# Patient Record
Sex: Female | Born: 1960 | Race: White | Hispanic: No | Marital: Married | Smoking: Never smoker
Health system: Southern US, Community
[De-identification: ages and names within clinical notes are randomized; demographics above are authoritative.]

## PROBLEM LIST (undated history)

## (undated) DIAGNOSIS — D699 Hemorrhagic condition, unspecified: Secondary | ICD-10-CM

## (undated) HISTORY — PX: APPENDECTOMY: SHX54

## (undated) HISTORY — PX: WISDOM TOOTH EXTRACTION: SHX21

## (undated) HISTORY — PX: LAPAROSCOPY: SHX197

## (undated) HISTORY — PX: CERVICAL CONE BIOPSY: SUR198

## (undated) HISTORY — PX: KNEE SURGERY: SHX244

---

## 1998-12-07 ENCOUNTER — Inpatient Hospital Stay (HOSPITAL_COMMUNITY): Admission: EM | Admit: 1998-12-07 | Discharge: 1998-12-08 | Payer: Self-pay | Admitting: Internal Medicine

## 1998-12-07 ENCOUNTER — Encounter: Payer: Self-pay | Admitting: Internal Medicine

## 1999-11-30 ENCOUNTER — Other Ambulatory Visit: Admission: RE | Admit: 1999-11-30 | Discharge: 1999-11-30 | Payer: Self-pay | Admitting: *Deleted

## 2000-12-26 ENCOUNTER — Other Ambulatory Visit: Admission: RE | Admit: 2000-12-26 | Discharge: 2000-12-26 | Payer: Self-pay | Admitting: *Deleted

## 2001-12-28 ENCOUNTER — Emergency Department (HOSPITAL_COMMUNITY): Admission: EM | Admit: 2001-12-28 | Discharge: 2001-12-28 | Payer: Self-pay | Admitting: Emergency Medicine

## 2001-12-30 ENCOUNTER — Encounter: Payer: Self-pay | Admitting: Gastroenterology

## 2001-12-30 ENCOUNTER — Ambulatory Visit (HOSPITAL_COMMUNITY): Admission: RE | Admit: 2001-12-30 | Discharge: 2001-12-30 | Payer: Self-pay | Admitting: Gastroenterology

## 2005-04-23 HISTORY — PX: ABDOMINAL HYSTERECTOMY: SHX81

## 2015-03-28 ENCOUNTER — Encounter: Payer: Self-pay | Admitting: Internal Medicine

## 2015-06-13 ENCOUNTER — Encounter: Payer: Self-pay | Admitting: Gastroenterology

## 2015-08-04 ENCOUNTER — Ambulatory Visit (AMBULATORY_SURGERY_CENTER): Payer: Self-pay | Admitting: *Deleted

## 2015-08-04 VITALS — Ht 64.0 in | Wt 164.0 lb

## 2015-08-04 DIAGNOSIS — Z1211 Encounter for screening for malignant neoplasm of colon: Secondary | ICD-10-CM

## 2015-08-04 MED ORDER — NA SULFATE-K SULFATE-MG SULF 17.5-3.13-1.6 GM/177ML PO SOLN: 1.0000 | Freq: Once | ORAL | Status: DC

## 2015-08-04 NOTE — Progress Notes (Signed)
No egg or soy allergy known to patient  No issues with past sedation with any surgeries  or procedures, no intubation problems  No diet pills per patient No home 02 use per patient  No blood thinners per patient  Pt denies issues with constipation  emmi video declined

## 2015-08-19 ENCOUNTER — Ambulatory Visit (AMBULATORY_SURGERY_CENTER): Payer: BLUE CROSS/BLUE SHIELD | Admitting: Gastroenterology

## 2015-08-19 VITALS — BP 126/72 | HR 55 | Temp 97.5°F | Resp 12 | Ht 64.0 in | Wt 164.0 lb

## 2015-08-19 DIAGNOSIS — Z1211 Encounter for screening for malignant neoplasm of colon: Secondary | ICD-10-CM

## 2015-08-19 DIAGNOSIS — K621 Rectal polyp: Secondary | ICD-10-CM | POA: Diagnosis not present

## 2015-08-19 DIAGNOSIS — D128 Benign neoplasm of rectum: Secondary | ICD-10-CM

## 2015-08-19 DIAGNOSIS — K635 Polyp of colon: Secondary | ICD-10-CM | POA: Diagnosis not present

## 2015-08-19 MED ORDER — SODIUM CHLORIDE 0.9 % IV SOLN: 500.0000 mL | INTRAVENOUS | Status: DC

## 2015-08-19 NOTE — Patient Instructions (Signed)
Discharge instructions given. Handouts on polyps and hemorrhoids. Resume previous medications. YOU HAD AN ENDOSCOPIC PROCEDURE TODAY AT THE [facility]:   Refer to the procedure report that was given to you for any specific questions about what was found during the examination.  If the procedure report does not answer your questions, please call your gastroenterologist to clarify.  If you requested that your care partner not be given the details of your procedure findings, then the procedure report has been included in a sealed envelope for you to review at your convenience later.  YOU SHOULD EXPECT: Some feelings of bloating in the abdomen. Passage of more gas than usual.  Walking can help get rid of the air that was put into your GI tract during the procedure and reduce the bloating. If you had a lower endoscopy (such as a colonoscopy or flexible sigmoidoscopy) you may notice spotting of blood in your stool or on the toilet paper. If you underwent a bowel prep for your procedure, you may not have a normal bowel movement for a few days.  Please Note:  You might notice some irritation and congestion in your nose or some drainage.  This is from the oxygen used during your procedure.  There is no need for concern and it should clear up in a day or so.  SYMPTOMS TO REPORT IMMEDIATELY:   Following lower endoscopy (colonoscopy or flexible sigmoidoscopy):  Excessive amounts of blood in the stool  Significant tenderness or worsening of abdominal pains  Swelling of the abdomen that is new, acute  Fever of 100F or higher   For urgent or emergent issues, a gastroenterologist can be reached at any hour by calling [phone].   DIET: Your first meal following the procedure should be a small meal and then it is ok to progress to your normal diet. Heavy or fried foods are harder to digest and may make you feel nauseous or bloated.  Likewise, meals heavy in dairy and vegetables can increase  bloating.  Drink plenty of fluids but you should avoid alcoholic beverages for 24 hours.  ACTIVITY:  You should plan to take it easy for the rest of today and you should NOT DRIVE or use heavy machinery until tomorrow (because of the sedation medicines used during the test).    FOLLOW UP: Our staff will call the number listed on your records the next business day following your procedure to check on you and address any questions or concerns that you may have regarding the information given to you following your procedure. If we do not reach you, we will leave a message.  However, if you are feeling well and you are not experiencing any problems, there is no need to return our call.  We will assume that you have returned to your regular daily activities without incident.  If any biopsies were taken you will be contacted by phone or by letter within the next 1-3 weeks.  Please call us at [phone] if you have not heard about the biopsies in 3 weeks.    SIGNATURES/CONFIDENTIALITY: You and/or your care partner have signed paperwork which will be entered into your electronic medical record.  These signatures attest to the fact that that the information above on your After Visit Summary has been reviewed and is understood.  Full responsibility of the confidentiality of this discharge information lies with you and/or your care-partner. 

## 2015-08-19 NOTE — Progress Notes (Signed)
To recovery, report to McCoy, RN, VSS 

## 2015-08-19 NOTE — Op Note (Signed)
[facility] Patient Name: Wanda Duke Procedure Date: 08/19/2015 10:41 AM MRN: [id number] Endoscopist: Remo Lipps P. Havery Moros , MD Age: 55 Date of Birth: 1961-02-19 Gender: Female Procedure:                Colonoscopy Indications:              Screening for malignant neoplasm in the colon, This                            is the patient's first colonoscopy Medicines:                Monitored Anesthesia Care Procedure:                Pre-Anesthesia Assessment:                           - Prior to the procedure, a History and Physical                            was performed, and patient medications and                            allergies were reviewed. The patient's tolerance of                            previous anesthesia was also reviewed. The risks                            and benefits of the procedure and the sedation                            options and risks were discussed with the patient.                            All questions were answered, and informed consent                            was obtained. Prior Anticoagulants: The patient has                            taken no previous anticoagulant or antiplatelet                            agents. ASA Grade Assessment: I - A normal, healthy                            patient. After reviewing the risks and benefits,                            the patient was deemed in satisfactory condition to                            undergo the procedure.  After obtaining informed consent, the colonoscope                            was passed under direct vision. Throughout the                            procedure, the patient's blood pressure, pulse, and                            oxygen saturations were monitored continuously. The                            Model PCF-H190L [id number] scope was introduced                            through the anus and advanced to the the cecum,           identified by appendiceal orifice and ileocecal                            valve. The colonoscopy was performed without                            difficulty. The patient tolerated the procedure                            well. The quality of the bowel preparation was                            good. The ileocecal valve, appendiceal orifice, and                            rectum were photographed. Scope In: 10:45:28 AM Scope Out: 11:07:00 AM Scope Withdrawal Time: 0 hours 17 minutes 40 seconds  Total Procedure Duration: 0 hours 21 minutes 32 seconds  Findings:                 The perianal and digital rectal examinations were                            normal.                           A 3 mm polyp was found in the recto-sigmoid colon.                            The polyp was sessile. The polyp was removed with a                            cold biopsy forceps. Resection and retrieval were                            complete.                           Non-bleeding internal  hemorrhoids were found during                            retroflexion. The hemorrhoids were small.                           The exam was otherwise without abnormality. Complications:            No immediate complications. Estimated blood loss:                            Minimal. Estimated Blood Loss:     Estimated blood loss was minimal. Impression:               - One 3 mm polyp at the recto-sigmoid colon,                            removed with a cold biopsy forceps. Resected and                            retrieved.                           - Non-bleeding small internal hemorrhoids.                           - The examination was otherwise normal. Recommendation:           - Patient has a contact number available for                            emergencies. The signs and symptoms of potential                            delayed complications were discussed with the                            patient. Return  to normal activities tomorrow.                            Written discharge instructions were provided to the                            patient.                           - Resume previous diet.                           - Continue present medications.                           - Await pathology results.                           - Repeat colonoscopy is recommended for  surveillance if the polyp that was removed is                            determined to be a pre-cancerous type of polyp. The                            colonoscopy date will be determined after pathology                            results from today's exam become available for                            review. Remo Lipps P. Armbruster, MD 08/19/2015 11:10:01 AM This report has been signed electronically.

## 2015-08-19 NOTE — Progress Notes (Signed)
Called to room to assist during endoscopic procedure.  Patient ID and intended procedure confirmed with present staff. Received instructions for my participation in the procedure from the performing physician.  

## 2015-08-22 ENCOUNTER — Telehealth: Payer: Self-pay | Admitting: *Deleted

## 2015-08-22 NOTE — Telephone Encounter (Signed)
  Follow up Call-  Call back number 08/19/2015  Post procedure Call Back phone  # [phone]  Permission to leave phone message Yes     Patient questions:  Do you have a fever, pain , or abdominal swelling? No. Pain Score  0 *  Have you tolerated food without any problems? Yes.    Have you been able to return to your normal activities? Yes.    Do you have any questions about your discharge instructions: Diet   No. Medications  No. Follow up visit  No.  Do you have questions or concerns about your Care? No.  Actions: * If pain score is 4 or above: No action needed, pain <4.

## 2015-08-29 ENCOUNTER — Encounter: Payer: Self-pay | Admitting: Gastroenterology

## 2015-11-03 DIAGNOSIS — Z6828 Body mass index (BMI) 28.0-28.9, adult: Secondary | ICD-10-CM | POA: Diagnosis not present

## 2015-11-03 DIAGNOSIS — L0889 Other specified local infections of the skin and subcutaneous tissue: Secondary | ICD-10-CM | POA: Diagnosis not present

## 2016-01-17 DIAGNOSIS — J069 Acute upper respiratory infection, unspecified: Secondary | ICD-10-CM | POA: Diagnosis not present

## 2016-01-23 DIAGNOSIS — Z0000 Encounter for general adult medical examination without abnormal findings: Secondary | ICD-10-CM | POA: Diagnosis not present

## 2016-01-23 DIAGNOSIS — N390 Urinary tract infection, site not specified: Secondary | ICD-10-CM | POA: Diagnosis not present

## 2016-01-23 DIAGNOSIS — R8299 Other abnormal findings in urine: Secondary | ICD-10-CM | POA: Diagnosis not present

## 2016-01-30 DIAGNOSIS — Z6829 Body mass index (BMI) 29.0-29.9, adult: Secondary | ICD-10-CM | POA: Diagnosis not present

## 2016-01-30 DIAGNOSIS — Z780 Asymptomatic menopausal state: Secondary | ICD-10-CM | POA: Diagnosis not present

## 2016-01-30 DIAGNOSIS — Z0000 Encounter for general adult medical examination without abnormal findings: Secondary | ICD-10-CM | POA: Diagnosis not present

## 2016-01-30 DIAGNOSIS — I8390 Asymptomatic varicose veins of unspecified lower extremity: Secondary | ICD-10-CM | POA: Diagnosis not present

## 2016-01-30 DIAGNOSIS — Z1389 Encounter for screening for other disorder: Secondary | ICD-10-CM | POA: Diagnosis not present

## 2016-04-30 ENCOUNTER — Other Ambulatory Visit: Payer: Self-pay | Admitting: Internal Medicine

## 2016-04-30 DIAGNOSIS — Z8249 Family history of ischemic heart disease and other diseases of the circulatory system: Secondary | ICD-10-CM

## 2016-05-04 ENCOUNTER — Ambulatory Visit
Admission: RE | Admit: 2016-05-04 | Discharge: 2016-05-04 | Disposition: A | Discharge status: Home / Self Care | Payer: No Typology Code available for payment source | Source: Ambulatory Visit | Attending: Internal Medicine | Admitting: Internal Medicine

## 2016-05-04 DIAGNOSIS — Z8249 Family history of ischemic heart disease and other diseases of the circulatory system: Secondary | ICD-10-CM

## 2016-05-06 ENCOUNTER — Telehealth: Payer: BLUE CROSS/BLUE SHIELD | Admitting: Family

## 2016-05-06 DIAGNOSIS — J111 Influenza due to unidentified influenza virus with other respiratory manifestations: Secondary | ICD-10-CM

## 2016-05-06 MED ORDER — BENZONATATE 100 MG PO CAPS: 100.0000 mg | ORAL_CAPSULE | Freq: Three times a day (TID) | ORAL | 0 refills | Status: DC | PRN

## 2016-05-06 MED ORDER — OSELTAMIVIR PHOSPHATE 75 MG PO CAPS: 75.0000 mg | ORAL_CAPSULE | Freq: Two times a day (BID) | ORAL | 0 refills | Status: DC

## 2016-05-06 NOTE — Progress Notes (Signed)
E visit for Flu like symptoms   We are sorry that you are not feeling well.  Here is how we plan to help! Based on what you have shared with me it looks like you may have a respiratory virus that may be influenza.  Influenza or "the flu" is   an infection caused by a respiratory virus. The flu virus is highly contagious and persons who did not receive their yearly flu vaccination may "catch" the flu from close contact.  We have anti-viral medications to treat the viruses that cause this infection. They are not a "cure" and only shorten the course of the infection. These prescriptions are most effective when they are given within the first 2 days of "flu" symptoms. Antiviral medication are indicated if you have a high risk of complications from the flu. You should  also consider an antiviral medication if you are in close contact with someone who is at risk. These medications can help patients avoid complications from the flu  but have side effects that you should know. Possible side effects from Tamiflu or oseltamivir include nausea, vomiting, diarrhea, dizziness, headaches, eye redness, sleep problems or other respiratory symptoms. You should not take Tamiflu if you have an allergy to oseltamivir or any to the ingredients in Tamiflu.  Based upon your symptoms and potential risk factors I have prescribed Oseltamivir (Tamiflu).  It has been sent to your designated pharmacy.  You will take one 75 mg capsule orally twice a day for the next 5 days.   You meet nearly all the criteria for the flu aside from high fever and rash. There is a lot going around right now and I would use caution. If it is not the flu, it is consistent with a respiratory virus in which case the care is supportive. I am prescribing Tessalon Perles (100-200mg , 1-2capsules) every 8 hours as needed for cough also.   ANYONE WHO HAS FLU SYMPTOMS SHOULD: . Stay home. The flu is highly contagious and going out or to work exposes  others! . Be sure to drink plenty of fluids. Water is fine as well as fruit juices, sodas and electrolyte beverages. You may want to stay away from caffeine or alcohol. If you are nauseated, try taking small sips of liquids. How do you know if you are getting enough fluid? Your urine should be a pale yellow or almost colorless. . Get rest. . Taking a steamy shower or using a humidifier may help nasal congestion and ease sore throat pain. Using a saline nasal spray works much the same way. . Cough drops, hard candies and sore throat lozenges may ease your cough. . Line up a caregiver. Have someone check on you regularly.   GET HELP RIGHT AWAY IF: . You cannot keep down liquids or your medications. . You become short of breath . Your fell like you are going to pass out or loose consciousness. . Your symptoms persist after you have completed your treatment plan MAKE SURE YOU   Understand these instructions.  Will watch your condition.  Will get help right away if you are not doing well or get worse.  Your e-visit answers were reviewed by a board certified advanced clinical practitioner to complete your personal care plan.  Depending on the condition, your plan could have included both over the counter or prescription medications.  If there is a problem please reply  once you have received a response from your provider.  Your safety is important to  [place].  If you have drug allergies check your prescription carefully.    You can use MyChart to ask questions about today's visit, request a non-urgent call back, or ask for a work or school excuse for 24 hours related to this e-Visit. If it has been greater than 24 hours you will need to follow up with your provider, or enter a new e-Visit to address those concerns.  You will get an e-mail in the next two days asking about your experience.  I hope that your e-visit has been valuable and will speed your recovery. Thank you for using e-visits.

## 2016-12-04 ENCOUNTER — Ambulatory Visit (HOSPITAL_COMMUNITY)
Admission: EM | Admit: 2016-12-04 | Discharge: 2016-12-04 | Disposition: A | Discharge status: Home / Self Care | Payer: BLUE CROSS/BLUE SHIELD | Source: Home / Self Care

## 2016-12-04 ENCOUNTER — Encounter (HOSPITAL_COMMUNITY): Payer: Self-pay | Admitting: Nurse Practitioner

## 2016-12-04 ENCOUNTER — Observation Stay (HOSPITAL_COMMUNITY)
Admission: EM | Admit: 2016-12-04 | Discharge: 2016-12-05 | Disposition: A | Discharge status: Home / Self Care | Payer: BLUE CROSS/BLUE SHIELD | Attending: Physician Assistant | Admitting: Physician Assistant

## 2016-12-04 ENCOUNTER — Encounter (HOSPITAL_COMMUNITY)
Admission: EM | Disposition: A | Discharge status: Home / Self Care | Payer: Self-pay | Source: Home / Self Care | Attending: Emergency Medicine

## 2016-12-04 ENCOUNTER — Emergency Department (HOSPITAL_COMMUNITY): Payer: BLUE CROSS/BLUE SHIELD | Admitting: Anesthesiology

## 2016-12-04 ENCOUNTER — Encounter (HOSPITAL_COMMUNITY): Payer: Self-pay | Admitting: Emergency Medicine

## 2016-12-04 ENCOUNTER — Emergency Department (HOSPITAL_COMMUNITY): Payer: BLUE CROSS/BLUE SHIELD

## 2016-12-04 DIAGNOSIS — R1110 Vomiting, unspecified: Secondary | ICD-10-CM | POA: Diagnosis not present

## 2016-12-04 DIAGNOSIS — R109 Unspecified abdominal pain: Secondary | ICD-10-CM | POA: Diagnosis not present

## 2016-12-04 DIAGNOSIS — R1031 Right lower quadrant pain: Secondary | ICD-10-CM

## 2016-12-04 DIAGNOSIS — R112 Nausea with vomiting, unspecified: Secondary | ICD-10-CM | POA: Diagnosis not present

## 2016-12-04 DIAGNOSIS — Z90710 Acquired absence of both cervix and uterus: Secondary | ICD-10-CM | POA: Diagnosis not present

## 2016-12-04 DIAGNOSIS — K37 Unspecified appendicitis: Secondary | ICD-10-CM | POA: Diagnosis present

## 2016-12-04 DIAGNOSIS — K353 Acute appendicitis with localized peritonitis: Secondary | ICD-10-CM | POA: Diagnosis not present

## 2016-12-04 DIAGNOSIS — G8929 Other chronic pain: Secondary | ICD-10-CM | POA: Diagnosis not present

## 2016-12-04 DIAGNOSIS — Z881 Allergy status to other antibiotic agents status: Secondary | ICD-10-CM | POA: Diagnosis not present

## 2016-12-04 DIAGNOSIS — K3580 Unspecified acute appendicitis: Secondary | ICD-10-CM | POA: Diagnosis not present

## 2016-12-04 DIAGNOSIS — Z9889 Other specified postprocedural states: Secondary | ICD-10-CM | POA: Diagnosis not present

## 2016-12-04 HISTORY — PX: LAPAROSCOPIC APPENDECTOMY: SHX408

## 2016-12-04 LAB — COMPREHENSIVE METABOLIC PANEL
ALT: 12 U/L — ABNORMAL LOW (ref 14–54)
AST: 16 U/L (ref 15–41)
Albumin: 3.7 g/dL (ref 3.5–5.0)
Alkaline Phosphatase: 80 U/L (ref 38–126)
Anion gap: 6 (ref 5–15)
BUN: 12 mg/dL (ref 6–20)
CO2: 28 mmol/L (ref 22–32)
Calcium: 8.8 mg/dL — ABNORMAL LOW (ref 8.9–10.3)
Chloride: 98 mmol/L — ABNORMAL LOW (ref 101–111)
Creatinine, Ser: 0.85 mg/dL (ref 0.44–1.00)
GFR calc Af Amer: >60 mL/min (ref >60)
GFR calc non Af Amer: >60 mL/min (ref >60)
Glucose, Bld: 91 mg/dL (ref 65–99)
Potassium: 3.1 mmol/L — ABNORMAL LOW (ref 3.5–5.1)
Sodium: 132 mmol/L — ABNORMAL LOW (ref 135–145)
Total Bilirubin: 0.8 mg/dL (ref 0.3–1.2)
Total Protein: 6.6 g/dL (ref 6.5–8.1)

## 2016-12-04 LAB — CBC WITH DIFFERENTIAL/PLATELET
Basophils Absolute: 0 10*3/uL (ref 0.0–0.1)
Basophils Relative: 0 %
Eosinophils Absolute: 0.1 10*3/uL (ref 0.0–0.7)
Eosinophils Relative: 1 %
HCT: 36.1 % (ref 36.0–46.0)
Hemoglobin: 12.1 g/dL (ref 12.0–15.0)
Lymphocytes Relative: 24 %
Lymphs Abs: 2.2 10*3/uL (ref 0.7–4.0)
MCH: 28.9 pg (ref 26.0–34.0)
MCHC: 33.5 g/dL (ref 30.0–36.0)
MCV: 86.2 fL (ref 78.0–100.0)
Monocytes Absolute: 0.6 10*3/uL (ref 0.1–1.0)
Monocytes Relative: 7 %
Neutro Abs: 6.1 10*3/uL (ref 1.7–7.7)
Neutrophils Relative %: 68 %
Platelets: 209 10*3/uL (ref 150–400)
RBC: 4.19 MIL/uL (ref 3.87–5.11)
RDW: 12.5 % (ref 11.5–15.5)
WBC: 9 10*3/uL (ref 4.0–10.5)

## 2016-12-04 LAB — LIPASE, BLOOD: Lipase: 29 U/L (ref 11–51)

## 2016-12-04 SURGERY — APPENDECTOMY, LAPAROSCOPIC
Anesthesia: General | Site: Abdomen

## 2016-12-04 MED ORDER — MIDAZOLAM HCL 5 MG/5ML IJ SOLN
INTRAMUSCULAR | Status: DC | PRN
  Administered 2016-12-04: 2 mg via INTRAVENOUS

## 2016-12-04 MED ORDER — DEXAMETHASONE SODIUM PHOSPHATE 10 MG/ML IJ SOLN
INTRAMUSCULAR | Status: DC | PRN
  Administered 2016-12-04: 6 mg via INTRAVENOUS

## 2016-12-04 MED ORDER — DEXAMETHASONE SODIUM PHOSPHATE 10 MG/ML IJ SOLN
INTRAMUSCULAR | Status: AC
  Filled 2016-12-04: qty 3

## 2016-12-04 MED ORDER — BUPIVACAINE-EPINEPHRINE 0.25% -1:200000 IJ SOLN
INTRAMUSCULAR | Status: DC | PRN
  Administered 2016-12-04: 10 mL

## 2016-12-04 MED ORDER — METRONIDAZOLE IN NACL 5-0.79 MG/ML-% IV SOLN
500.0000 mg | Freq: Once | INTRAVENOUS | Status: AC
Start: 2016-12-04 — End: 2016-12-04
  Administered 2016-12-04: 500 mg via INTRAVENOUS
  Filled 2016-12-04: qty 100

## 2016-12-04 MED ORDER — SIMETHICONE 80 MG PO CHEW: 40.0000 mg | CHEWABLE_TABLET | Freq: Four times a day (QID) | ORAL | Status: DC | PRN

## 2016-12-04 MED ORDER — 0.9 % SODIUM CHLORIDE (POUR BTL) OPTIME
TOPICAL | Status: DC | PRN
  Administered 2016-12-04: 1000 mL

## 2016-12-04 MED ORDER — METHOCARBAMOL 500 MG PO TABS: 500.0000 mg | ORAL_TABLET | Freq: Four times a day (QID) | ORAL | Status: DC | PRN

## 2016-12-04 MED ORDER — OXYCODONE HCL 5 MG PO TABS
5.0000 mg | ORAL_TABLET | ORAL | Status: DC | PRN
  Administered 2016-12-05 (×2): 10 mg via ORAL
  Filled 2016-12-04 (×2): qty 2

## 2016-12-04 MED ORDER — LIDOCAINE 2% (20 MG/ML) 5 ML SYRINGE
INTRAMUSCULAR | Status: AC
  Filled 2016-12-04: qty 15

## 2016-12-04 MED ORDER — MORPHINE SULFATE (PF) 4 MG/ML IV SOLN: 2.0000 mg | INTRAVENOUS | Status: DC | PRN

## 2016-12-04 MED ORDER — BUPIVACAINE-EPINEPHRINE (PF) 0.25% -1:200000 IJ SOLN
INTRAMUSCULAR | Status: AC
  Filled 2016-12-04: qty 30

## 2016-12-04 MED ORDER — MIDAZOLAM HCL 2 MG/2ML IJ SOLN: 0.5000 mg | Freq: Once | INTRAMUSCULAR | Status: DC | PRN

## 2016-12-04 MED ORDER — SUGAMMADEX SODIUM 200 MG/2ML IV SOLN
INTRAVENOUS | Status: AC
  Filled 2016-12-04: qty 2

## 2016-12-04 MED ORDER — ONDANSETRON HCL 4 MG/2ML IJ SOLN
4.0000 mg | Freq: Four times a day (QID) | INTRAMUSCULAR | Status: DC | PRN
  Administered 2016-12-05: 4 mg via INTRAVENOUS
  Filled 2016-12-04: qty 2

## 2016-12-04 MED ORDER — ENOXAPARIN SODIUM 40 MG/0.4ML ~~LOC~~ SOLN
40.0000 mg | SUBCUTANEOUS | Status: DC
  Administered 2016-12-05: 40 mg via SUBCUTANEOUS
  Filled 2016-12-04: qty 0.4

## 2016-12-04 MED ORDER — SCOPOLAMINE 1 MG/3DAYS TD PT72
MEDICATED_PATCH | TRANSDERMAL | Status: AC
  Filled 2016-12-04: qty 1

## 2016-12-04 MED ORDER — HYDROMORPHONE HCL 1 MG/ML IJ SOLN
0.2500 mg | INTRAMUSCULAR | Status: DC | PRN
  Administered 2016-12-04 (×2): 0.5 mg via INTRAVENOUS

## 2016-12-04 MED ORDER — SODIUM CHLORIDE 0.9 % IV SOLN
INTRAVENOUS | Status: DC | PRN
  Administered 2016-12-04 (×2): via INTRAVENOUS

## 2016-12-04 MED ORDER — FENTANYL CITRATE (PF) 250 MCG/5ML IJ SOLN
INTRAMUSCULAR | Status: AC
  Filled 2016-12-04: qty 5

## 2016-12-04 MED ORDER — PHENYLEPHRINE 40 MCG/ML (10ML) SYRINGE FOR IV PUSH (FOR BLOOD PRESSURE SUPPORT)
PREFILLED_SYRINGE | INTRAVENOUS | Status: AC
  Filled 2016-12-04: qty 20

## 2016-12-04 MED ORDER — KETOROLAC TROMETHAMINE 30 MG/ML IJ SOLN
INTRAMUSCULAR | Status: AC
  Filled 2016-12-04: qty 1

## 2016-12-04 MED ORDER — HYDROMORPHONE HCL 1 MG/ML IJ SOLN
INTRAMUSCULAR | Status: AC
  Filled 2016-12-04: qty 1

## 2016-12-04 MED ORDER — MIDAZOLAM HCL 2 MG/2ML IJ SOLN
INTRAMUSCULAR | Status: AC
  Filled 2016-12-04: qty 2

## 2016-12-04 MED ORDER — SCOPOLAMINE 1 MG/3DAYS TD PT72
MEDICATED_PATCH | TRANSDERMAL | Status: DC | PRN
  Administered 2016-12-04: 1 via TRANSDERMAL

## 2016-12-04 MED ORDER — DEXTROSE 5 % IV SOLN: 2.0000 g | INTRAVENOUS | Status: DC

## 2016-12-04 MED ORDER — FENTANYL CITRATE (PF) 250 MCG/5ML IJ SOLN
INTRAMUSCULAR | Status: DC | PRN
  Administered 2016-12-04: 100 ug via INTRAVENOUS

## 2016-12-04 MED ORDER — ONDANSETRON HCL 4 MG/2ML IJ SOLN
INTRAMUSCULAR | Status: DC | PRN
  Administered 2016-12-04: 4 mg via INTRAVENOUS

## 2016-12-04 MED ORDER — MEPERIDINE HCL 25 MG/ML IJ SOLN: 6.2500 mg | INTRAMUSCULAR | Status: DC | PRN

## 2016-12-04 MED ORDER — ONDANSETRON HCL 4 MG/2ML IJ SOLN
INTRAMUSCULAR | Status: AC
  Filled 2016-12-04: qty 6

## 2016-12-04 MED ORDER — DEXTROSE 5 % IV SOLN
2.0000 g | Freq: Once | INTRAVENOUS | Status: AC
  Administered 2016-12-04: 2 g via INTRAVENOUS
  Filled 2016-12-04: qty 2

## 2016-12-04 MED ORDER — SUCCINYLCHOLINE CHLORIDE 200 MG/10ML IV SOSY
PREFILLED_SYRINGE | INTRAVENOUS | Status: DC | PRN
  Administered 2016-12-04: 120 mg via INTRAVENOUS

## 2016-12-04 MED ORDER — PROPOFOL 10 MG/ML IV BOLUS
INTRAVENOUS | Status: DC | PRN
  Administered 2016-12-04: 200 mg via INTRAVENOUS

## 2016-12-04 MED ORDER — IOPAMIDOL (ISOVUE-300) INJECTION 61%
INTRAVENOUS | Status: AC
  Administered 2016-12-04: 100 mL
  Filled 2016-12-04: qty 100

## 2016-12-04 MED ORDER — SODIUM CHLORIDE 0.9 % IR SOLN
Status: DC | PRN
  Administered 2016-12-04: 1000 mL

## 2016-12-04 MED ORDER — PROMETHAZINE HCL 25 MG/ML IJ SOLN: 6.2500 mg | INTRAMUSCULAR | Status: DC | PRN

## 2016-12-04 MED ORDER — KETOROLAC TROMETHAMINE 30 MG/ML IJ SOLN
INTRAMUSCULAR | Status: DC | PRN
  Administered 2016-12-04: 30 mg via INTRAVENOUS

## 2016-12-04 MED ORDER — ACETAMINOPHEN 500 MG PO TABS
1000.0000 mg | ORAL_TABLET | Freq: Four times a day (QID) | ORAL | Status: DC
  Administered 2016-12-04 – 2016-12-05 (×3): 1000 mg via ORAL
  Filled 2016-12-04 (×3): qty 2

## 2016-12-04 MED ORDER — ROCURONIUM BROMIDE 10 MG/ML (PF) SYRINGE
PREFILLED_SYRINGE | INTRAVENOUS | Status: AC
  Filled 2016-12-04: qty 5

## 2016-12-04 MED ORDER — SUGAMMADEX SODIUM 200 MG/2ML IV SOLN
INTRAVENOUS | Status: DC | PRN
Start: 2016-12-04 — End: 2016-12-04
  Administered 2016-12-04: 200 mg via INTRAVENOUS

## 2016-12-04 MED ORDER — SODIUM CHLORIDE 0.9 % IV SOLN
INTRAVENOUS | Status: DC
  Administered 2016-12-04: via INTRAVENOUS

## 2016-12-04 MED ORDER — ROCURONIUM BROMIDE 10 MG/ML (PF) SYRINGE
PREFILLED_SYRINGE | INTRAVENOUS | Status: DC | PRN
  Administered 2016-12-04: 35 mg via INTRAVENOUS

## 2016-12-04 MED ORDER — METRONIDAZOLE IN NACL 5-0.79 MG/ML-% IV SOLN
500.0000 mg | Freq: Three times a day (TID) | INTRAVENOUS | Status: DC
  Administered 2016-12-05 (×2): 500 mg via INTRAVENOUS
  Filled 2016-12-04 (×3): qty 100

## 2016-12-04 MED ORDER — ONDANSETRON 4 MG PO TBDP: 4.0000 mg | ORAL_TABLET | Freq: Four times a day (QID) | ORAL | Status: DC | PRN

## 2016-12-04 MED ORDER — KETOROLAC TROMETHAMINE 15 MG/ML IJ SOLN
15.0000 mg | Freq: Four times a day (QID) | INTRAMUSCULAR | Status: DC | PRN
Start: 2016-12-04 — End: 2016-12-05
  Administered 2016-12-05: 15 mg via INTRAVENOUS
  Filled 2016-12-04: qty 1

## 2016-12-04 SURGICAL SUPPLY — 43 items
ADH SKN CLS LQ APL DERMABOND (GAUZE/BANDAGES/DRESSINGS) ×1
APPLIER CLIP ROT 10 11.4 M/L (STAPLE)
CANISTER SUCT 3000ML PPV (MISCELLANEOUS) ×2 IMPLANT
CHLORAPREP W/TINT 26ML (MISCELLANEOUS) ×2 IMPLANT
CLIP APPLIE ROT 10 11.4 M/L (STAPLE) IMPLANT
COVER SURGICAL LIGHT HANDLE (MISCELLANEOUS) ×2 IMPLANT
CUTTER FLEX LINEAR 45M (STAPLE) ×2 IMPLANT
DERMABOND ADHESIVE PROPEN (GAUZE/BANDAGES/DRESSINGS) ×1
DERMABOND ADVANCED (GAUZE/BANDAGES/DRESSINGS) ×1
DERMABOND ADVANCED .7 DNX12 (GAUZE/BANDAGES/DRESSINGS) ×1 IMPLANT
DERMABOND ADVANCED .7 DNX6 (GAUZE/BANDAGES/DRESSINGS) ×1 IMPLANT
DEVICE TROCAR PUNCTURE CLOSURE (ENDOMECHANICALS) ×2 IMPLANT
ELECT REM PT RETURN 9FT ADLT (ELECTROSURGICAL) ×2
ELECTRODE REM PT RTRN 9FT ADLT (ELECTROSURGICAL) ×1 IMPLANT
GLOVE BIO SURGEON STRL SZ7 (GLOVE) ×2 IMPLANT
GLOVE BIOGEL PI IND STRL 7.5 (GLOVE) ×1 IMPLANT
GLOVE BIOGEL PI INDICATOR 7.5 (GLOVE) ×1
GOWN STRL REUS W/ TWL LRG LVL3 (GOWN DISPOSABLE) ×3 IMPLANT
GOWN STRL REUS W/TWL LRG LVL3 (GOWN DISPOSABLE) ×6
KIT BASIN OR (CUSTOM PROCEDURE TRAY) ×2 IMPLANT
KIT ROOM TURNOVER OR (KITS) ×2 IMPLANT
NS IRRIG 1000ML POUR BTL (IV SOLUTION) ×2 IMPLANT
PAD ARMBOARD 7.5X6 YLW CONV (MISCELLANEOUS) ×4 IMPLANT
POUCH RETRIEVAL ECOSAC 10 (ENDOMECHANICALS) ×1 IMPLANT
POUCH RETRIEVAL ECOSAC 10MM (ENDOMECHANICALS) ×1
RELOAD 45 VASCULAR/THIN (ENDOMECHANICALS) ×2 IMPLANT
RELOAD STAPLE TA45 3.5 REG BLU (ENDOMECHANICALS) ×2 IMPLANT
SCISSORS LAP 5X35 DISP (ENDOMECHANICALS) IMPLANT
SET IRRIG TUBING LAPAROSCOPIC (IRRIGATION / IRRIGATOR) ×2 IMPLANT
SHEARS HARMONIC ACE PLUS 36CM (ENDOMECHANICALS) ×2 IMPLANT
SLEEVE ENDOPATH XCEL 5M (ENDOMECHANICALS) ×2 IMPLANT
SPECIMEN JAR SMALL (MISCELLANEOUS) ×2 IMPLANT
STRIP CLOSURE SKIN 1/2X4 (GAUZE/BANDAGES/DRESSINGS) ×2 IMPLANT
SUT MNCRL AB 4-0 PS2 18 (SUTURE) ×2 IMPLANT
SUT VICRYL 0 UR6 27IN ABS (SUTURE) ×2 IMPLANT
TAPE STRIPS DRAPE STRL (GAUZE/BANDAGES/DRESSINGS) ×2 IMPLANT
TOWEL OR 17X24 6PK STRL BLUE (TOWEL DISPOSABLE) ×2 IMPLANT
TOWEL OR 17X26 10 PK STRL BLUE (TOWEL DISPOSABLE) ×2 IMPLANT
TRAY FOLEY CATH SILVER 16FR (SET/KITS/TRAYS/PACK) ×2 IMPLANT
TRAY LAPAROSCOPIC MC (CUSTOM PROCEDURE TRAY) ×2 IMPLANT
TROCAR XCEL BLUNT TIP 100MML (ENDOMECHANICALS) ×2 IMPLANT
TROCAR XCEL NON-BLD 5MMX100MML (ENDOMECHANICALS) ×2 IMPLANT
TUBING INSUFFLATION (TUBING) ×2 IMPLANT

## 2016-12-04 NOTE — ED Provider Notes (Signed)
[facility] DEPT Provider Note   CSN: [id number] Arrival date & time: 12/04/16  1740     History   Chief Complaint Chief Complaint  Patient presents with  . Abdominal Pain    HPI Wanda Duke is a 56 y.o. female.  HPI   56 year old female presents today with complaints of abdominal pain. Patient reports approximately 3 days ago she developed right lower quadrant abdominal pain. She notes this was sharp in nature, no radiation of symptoms. She notes this is worse with palpation, worse with ambulation. She notes symptoms continue to worsen. She developed nonbloody diarrhea, low-grade fever last night, some nausea with no vomiting. Patient denies any history of the same. Patient reports no close sick contacts. Patient does not take any daily medications. Patient has a history of 2 C-sections, status post abdominal hysterectomy.  Patient notes her last by mouth intake was around 3:30 today when she had lunch and also took Aleve.     History reviewed. No pertinent past medical history.  There are no active problems to display for this patient.   Past Surgical History:  Procedure Laterality Date  . CESAREAN SECTION     x2  . HYSTEROTOMY    . LAPAROSCOPY     for endometriosis  . WISDOM TOOTH EXTRACTION      OB History    No data available       Home Medications    Prior to Admission medications   Medication Sig Start Date End Date Taking? Authorizing Provider  naproxen sodium (ANAPROX) 220 MG tablet Take 220-440 mg by mouth 2 (two) times daily as needed (for pain).   Yes [provider]  benzonatate (TESSALON PERLES) 100 MG capsule Take 1-2 capsules (100-200 mg total) by mouth every 8 (eight) hours as needed for cough. Patient not taking: Reported on 12/04/2016 05/06/16   Benjamine Mola, FNP  oseltamivir (TAMIFLU) 75 MG capsule Take 1 capsule (75 mg total) by mouth 2 (two) times daily. Patient not taking: Reported on 12/04/2016 05/06/16   Benjamine Mola, FNP      Family History Family History  Problem Relation Age of Onset  . Colon cancer Neg Hx   . Colon polyps Neg Hx   . Rectal cancer Neg Hx   . Stomach cancer Neg Hx     Social History Social History  Substance Use Topics  . Smoking status: Never Smoker  . Smokeless tobacco: Never Used  . Alcohol use 0.0 oz/week     Comment: rare      Allergies   Erythromycin   Review of Systems Review of Systems  All other systems reviewed and are negative.    Physical Exam Updated Vital Signs BP 130/75   Pulse 65   Temp 97.9 F (36.6 C) (Oral)   Resp 16   Ht 5' 3.5" (1.613 m)   Wt 74.4 kg (164 lb)   SpO2 99%   BMI 28.60 kg/m   Physical Exam  Constitutional: She is oriented to person, place, and time. She appears well-developed and well-nourished.  HENT:  Head: Normocephalic and atraumatic.  Eyes: Pupils are equal, round, and reactive to light. Conjunctivae are normal. Right eye exhibits no discharge. Left eye exhibits no discharge. No scleral icterus.  Neck: Normal range of motion. No JVD present. No tracheal deviation present.  Pulmonary/Chest: Effort normal. No stridor.  Abdominal:  Tenderness palpation or right lower quadrant  Neurological: She is alert and oriented to person, place, and time. Coordination normal.  Psychiatric: She has a normal mood and affect. Her behavior is normal. Judgment and thought content normal.  Nursing note and vitals reviewed.    ED Treatments / Results  Labs (all labs ordered are listed, but only abnormal results are displayed) Labs Reviewed  COMPREHENSIVE METABOLIC PANEL - Abnormal; Notable for the following:       Result Value   Sodium 132 (*)    Potassium 3.1 (*)    Chloride 98 (*)    Calcium 8.8 (*)    ALT 12 (*)    All other components within normal limits  CBC WITH DIFFERENTIAL/PLATELET  LIPASE, BLOOD  URINALYSIS, ROUTINE W REFLEX MICROSCOPIC    EKG  EKG Interpretation None       Radiology Ct Abdomen Pelvis W  Contrast  Result Date: 12/04/2016 CLINICAL DATA:  Abdominal pain for 2-3 days with nausea and vomiting EXAM: CT ABDOMEN AND PELVIS WITH CONTRAST TECHNIQUE: Multidetector CT imaging of the abdomen and pelvis was performed using the standard protocol following bolus administration of intravenous contrast. CONTRAST:  132mL ISOVUE-300 IOPAMIDOL (ISOVUE-300) INJECTION 61% COMPARISON:  Report 12/30/2001 FINDINGS: Lower chest: Lung bases demonstrate no acute consolidation or pleural effusion. Normal heart size. Hepatobiliary: Multiple subcentimeter hypodensities in the liver, too small to further characterize, probably cysts. No calcified gallstones or biliary dilatation Pancreas: Unremarkable. No pancreatic ductal dilatation or surrounding inflammatory changes. Spleen: Normal in size without focal abnormality. Adrenals/Urinary Tract: Adrenal glands are unremarkable. Kidneys are normal, without renal calculi, focal lesion, or hydronephrosis. Bladder is unremarkable. Stomach/Bowel: Stomach within normal limits. No dilated small bowel. Irregular thickening of the cecum. Enlarged appendix, measuring up to 13 mm in size. Mild edema and haziness of the surrounding fat in the right lower quadrant. Vascular/Lymphatic: No significant vascular findings are present. No enlarged abdominal or pelvic lymph nodes. Reproductive: Status post hysterectomy. No adnexal masses. Other: Negative for free air or free fluid Musculoskeletal: Degenerative changes. No acute or suspicious bone lesion. IMPRESSION: 1. Enlarged appendix up to 13 mm with mild edema/haziness in surrounding fat, concerning for an acute appendicitis. Negative for perforation or abscess. 2. There is wall thickening of the cecum, it is presumed that this is due to inflammation of the appendix 3. Subcentimeter liver hypodensities, too small to further characterize Electronically Signed   By: Donavan Foil M.D.   On: 12/04/2016 20:13    Procedures Procedures (including  critical care time)  Medications Ordered in ED Medications  cefTRIAXone (ROCEPHIN) 2 g in dextrose 5 % 50 mL IVPB (2 g Intravenous New Bag/Given 12/04/16 2049)    And  metroNIDAZOLE (FLAGYL) IVPB 500 mg (500 mg Intravenous New Bag/Given 12/04/16 2044)  iopamidol (ISOVUE-300) 61 % injection (100 mLs  Contrast Given 12/04/16 1956)     Initial Impression / Assessment and Plan / ED Course  I have reviewed the triage vital signs and the nursing notes.  Pertinent labs & imaging results that were available during my care of the patient were reviewed by me and considered in my medical decision making (see chart for details).      Final Clinical Impressions(s) / ED Diagnoses   Final diagnoses:  Acute appendicitis, unspecified acute appendicitis type    Labs: CBC, CMP, lipase  Imaging: CT abdomen and pelvis with  Consults: General Surgery   Therapeutics: Rocephin, Flagyl, normal saline  Discharge Meds:   Assessment/Plan:   56 year old female patient presents today with acute uncomplicated appendicitis. Gen. surgery consult at, antibiotics initiated here. Patient pain controlled without use of  pain medication.   New Prescriptions New Prescriptions   No medications on file     Francee Gentile 12/04/16 2117    Mesner, Corene Cornea, MD 12/04/16 2216

## 2016-12-04 NOTE — ED Notes (Signed)
Provider bedside explaining surgery

## 2016-12-04 NOTE — H&P (Signed)
Wanda Duke is an 56 y.o. female.   Chief Complaint: abd pain HPI: 15 yof who presents with Wanda Duke.  Wanda Duke has no fever. No n/v, has been having diarrhea.  Wanda Duke is otherwise healthy. Wanda Duke has nl colonoscopy last year.  Wanda Duke has prior lsc for infertility.   History reviewed. No pertinent past medical history.  Past Surgical History:  Procedure Laterality Date  . CESAREAN SECTION     x2  . HYSTEROTOMY    . LAPAROSCOPY     for endometriosis  . WISDOM TOOTH EXTRACTION      Family History  Problem Relation Age of Onset  . Colon cancer Neg Hx   . Colon polyps Neg Hx   . Rectal cancer Neg Hx   . Stomach cancer Neg Hx    Social History:  reports that Wanda Duke has never smoked. Wanda Duke has never used smokeless tobacco. Wanda Duke reports that Wanda Duke drinks alcohol. Wanda Duke reports that Wanda Duke does not use drugs.  Allergies:  Allergies  Allergen Reactions  . Erythromycin Nausea Only    Medications none  Results for orders placed or performed during the hospital encounter of 12/04/16 (from the past 48 hour(s))  CBC with Differential     Status: None   Collection Time: 12/04/16  6:40 PM  Result Value Ref Range   WBC 9.0 4.0 - 10.5 K/uL   RBC 4.19 3.87 - 5.11 MIL/uL   Hemoglobin 12.1 12.0 - 15.0 g/dL   HCT 36.1 36.0 - 46.0 %   MCV 86.2 78.0 - 100.0 fL   MCH 28.9 26.0 - 34.0 pg   MCHC 33.5 30.0 - 36.0 g/dL   RDW 12.5 11.5 - 15.5 %   Platelets 209 150 - 400 K/uL   Neutrophils Relative % 68 %   Neutro Abs 6.1 1.7 - 7.7 K/uL   Lymphocytes Relative 24 %   Lymphs Abs 2.2 0.7 - 4.0 K/uL   Monocytes Relative 7 %   Monocytes Absolute 0.6 0.1 - 1.0 K/uL   Eosinophils Relative 1 %   Eosinophils Absolute 0.1 0.0 - 0.7 K/uL   Basophils Relative 0 %   Basophils Absolute 0.0 0.0 - 0.1 K/uL  Comprehensive metabolic panel     Status: Abnormal   Collection Time: 12/04/16  6:40 PM  Result Value Ref Range   Sodium 132 (L) 135 - 145 mmol/L   Potassium 3.1 (L) 3.5 - 5.1 mmol/L   Chloride 98 (L) 101 -  111 mmol/L   CO2 28 22 - 32 mmol/L   Glucose, Bld 91 65 - 99 mg/dL   BUN 12 6 - 20 mg/dL   Creatinine, Ser 0.85 0.44 - 1.00 mg/dL   Calcium 8.8 (L) 8.9 - 10.3 mg/dL   Total Protein 6.6 6.5 - 8.1 g/dL   Albumin 3.7 3.5 - 5.0 g/dL   AST 16 15 - 41 U/L   ALT 12 (L) 14 - 54 U/L   Alkaline Phosphatase 80 38 - 126 U/L   Total Bilirubin 0.8 0.3 - 1.2 mg/dL   GFR calc non Af Amer >60 >60 mL/min   GFR calc Af Amer >60 >60 mL/min    Comment: (NOTE) The eGFR has been calculated using the CKD EPI equation. This calculation has not been validated in all clinical situations. eGFR's persistently <60 mL/min signify possible Chronic Kidney Disease.    Anion gap 6 5 - 15  Lipase, blood     Status: None   Collection Time: 12/04/16  6:40  PM  Result Value Ref Range   Lipase 29 11 - 51 U/L   Ct Abdomen Pelvis W Contrast  Result Date: 12/04/2016 CLINICAL DATA:  Abdominal pain for 2-3 days with nausea and vomiting EXAM: CT ABDOMEN AND PELVIS WITH CONTRAST TECHNIQUE: Multidetector CT imaging of the abdomen and pelvis was performed using the standard protocol following bolus administration of intravenous contrast. CONTRAST:  133m ISOVUE-300 IOPAMIDOL (ISOVUE-300) INJECTION 61% COMPARISON:  Report 12/30/2001 FINDINGS: Lower chest: Lung bases demonstrate no acute consolidation or pleural effusion. Normal heart size. Hepatobiliary: Multiple subcentimeter hypodensities in the liver, too small to further characterize, probably cysts. No calcified gallstones or biliary dilatation Pancreas: Unremarkable. No pancreatic ductal dilatation or surrounding inflammatory changes. Spleen: Normal in size without focal abnormality. Adrenals/Urinary Tract: Adrenal glands are unremarkable. Kidneys are normal, without renal calculi, focal lesion, or hydronephrosis. Bladder is unremarkable. Stomach/Bowel: Stomach within normal limits. No dilated small bowel. Irregular thickening of the cecum. Enlarged appendix, measuring up to 13 mm  in size. Mild edema and haziness of the surrounding fat in the right lower quadrant. Vascular/Lymphatic: No significant vascular findings are present. No enlarged abdominal or pelvic lymph nodes. Reproductive: Status post hysterectomy. No adnexal masses. Other: Negative for free air or free fluid Musculoskeletal: Degenerative changes. No acute or suspicious bone lesion. IMPRESSION: 1. Enlarged appendix up to 13 mm with mild edema/haziness in surrounding fat, concerning for an acute appendicitis. Negative for perforation or abscess. 2. There is wall thickening of the cecum, it is presumed that this is due to inflammation of the appendix 3. Subcentimeter liver hypodensities, too small to further characterize Electronically Signed   By: KDonavan FoilM.D.   On: 12/04/2016 20:13    Review of Systems  Gastrointestinal: Positive for abdominal pain and diarrhea.  All other systems reviewed and are negative.   Blood pressure 130/75, pulse 65, temperature 97.9 F (36.6 C), temperature source Oral, resp. rate 16, height 5' 3.5" (1.613 m), weight 74.4 kg (164 lb), SpO2 99 %. Physical Exam  Vitals reviewed. Constitutional: Wanda Duke is oriented to person, place, and time. Wanda Duke appears well-developed and well-nourished.  HENT:  Head: Normocephalic and atraumatic.  Right Ear: External ear normal.  Left Ear: External ear normal.  Eyes: No scleral icterus.  Neck: Neck supple.  Cardiovascular: Normal rate, regular rhythm and normal heart sounds.   Respiratory: Effort normal and breath sounds normal.  GI: Soft. Bowel sounds are normal. There is tenderness (Wanda).  Neurological: Wanda Duke is alert and oriented to person, place, and time.  Skin: Skin is warm and dry.     Assessment/Plan Appendicitis  Wanda Duke appears to have acute appendicitis. Discussed options and recommended lap appy. Risks including open surgery, postop abscess, bleeding, infection all discussed.  abx have been given. Will proceed tonight with  appendectomy  Wanda Geiger, MD 12/04/2016, 9:08 PM

## 2016-12-04 NOTE — ED Notes (Signed)
Patient transported to CT 

## 2016-12-04 NOTE — ED Provider Notes (Signed)
  [place]   [id number] 12/04/16 Arrival Time: 1700  ASSESSMENT & PLAN:  1. Abdominal pain, chronic, right lower quadrant     No orders of the defined types were placed in this encounter.   Reviewed expectations re: course of current medical issues. Questions answered. Outlined signs and symptoms indicating need for more acute intervention. Patient verbalized understanding. After Visit Summary given.   SUBJECTIVE:  Wanda Duke is a 56 y.o. female who presents with complaint of right lower quadrant abdominal pain and fever last night  ROS: As per HPI.   OBJECTIVE:  Vitals:   12/04/16 1722  BP: (!) 108/56  Pulse: 81  Resp: 17  Temp: 98.1 F (36.7 C)  TempSrc: Oral  SpO2: 98%     General appearance: alert; no distress HEENT: normocephalic; atraumatic; conjunctivae normal; TMs normal; nasal mucosa normal; oral mucosa normal Neck: supple Lungs: clear to auscultation bilaterally Heart: regular rate and rhythm Abdomen: Tender RLQ and periumbilical with guarding and rebound tenderness; bowel sounds normal; Neurologic: normal symmetric reflexes; normal gait Psychological:  alert and cooperative; normal mood and affect  No results found for this or any previous visit.  Labs Reviewed - No data to display  No results found.  Allergies  Allergen Reactions  . Erythromycin Nausea Only    PMHx, SurgHx, SocialHx, Medications, and Allergies were reviewed in the Visit Navigator and updated as appropriate.      Lysbeth Penner, [place] 12/04/16 Vernelle Emerald

## 2016-12-04 NOTE — ED Triage Notes (Signed)
Per pt. C/O of LRQ x2 days.  Pt was just at urgent care to rule out appendicitis.   VSS AXOx4

## 2016-12-04 NOTE — Anesthesia Postprocedure Evaluation (Signed)
Anesthesia Post Note  Patient: Wanda Duke  Procedure(s) Performed: Procedure(s) (LRB): APPENDECTOMY LAPAROSCOPIC (N/A)     Patient location during evaluation: PACU Anesthesia Type: General Level of consciousness: awake and alert, patient cooperative and oriented Pain management: pain level controlled Vital Signs Assessment: post-procedure vital signs reviewed and stable Respiratory status: spontaneous breathing, nonlabored ventilation and respiratory function stable Cardiovascular status: blood pressure returned to baseline and stable Postop Assessment: no signs of nausea or vomiting Anesthetic complications: no    Last Vitals:  Vitals:   12/04/16 2248 12/04/16 2300  BP: (!) 155/87 (!) 148/88  Pulse: 64 64  Resp: 15 17  Temp: (!) 36.2 C   SpO2: 99% 100%    Last Pain:  Vitals:   12/04/16 2300  TempSrc:   PainSc: 5                  Lucy Boardman,E. Kutter Schnepf

## 2016-12-04 NOTE — Discharge Instructions (Signed)
Please go to ED for higher level of care.

## 2016-12-04 NOTE — Anesthesia Procedure Notes (Signed)
Procedure Name: Intubation Date/Time: 12/04/2016 9:59 PM Performed by: Myna Bright Pre-anesthesia Checklist: Patient identified, Emergency Drugs available, Suction available and Patient being monitored Patient Re-evaluated:Patient Re-evaluated prior to induction Oxygen Delivery Method: Circle system utilized Preoxygenation: Pre-oxygenation with 100% oxygen Induction Type: IV induction, Rapid sequence and Cricoid Pressure applied Laryngoscope Size: Mac and 3 Grade View: Grade I Tube type: Oral Tube size: 7.0 mm Number of attempts: 1 Airway Equipment and Method: Stylet Placement Confirmation: ETT inserted through vocal cords under direct vision,  positive ETCO2 and breath sounds checked- equal and bilateral Secured at: 22 cm Tube secured with: Tape Dental Injury: Teeth and Oropharynx as per pre-operative assessment

## 2016-12-04 NOTE — Op Note (Signed)
Preoperative diagnosis: acute appendicitis Postoperative diagnosis: same as above Procedure: laparoscopic appendectomy Surgeon: Dr Serita Grammes Anesthesia: general EBL: minimal Drains none Specimen: appendix to pathology Complications: none Sponge count correct at completion Disposition to recovery stable  Indications: This is a 17 yof with acute appendicitis on ct scan and by clinical exam. We discussed laparoscopic appendectomy.   Procedure: After informed consent was obtained the patient was taken to the operating room. Shewas already onantibiotics. Sequential compression devices were on her legs. Shewas placed under general anesthesia without complication. Herabdomen was prepped and draped in the standard sterile surgical fashion. A surgical timeout was then performed.  I infiltrated marcaine belowthe umbilicus. I made a vertical incision and grasped the fascia. This was entered sharply. I then placed a 0 vicryl pursestring suture and inserted a Hasson trocarI then placed 2 other 5 mm trocars in llq and suprapubic region. I identified the appendix and grasped the base. The base was clean but thickened.  The appendix was not perforated. I divided the mesentery with the harmonic scalpel. I then divided the appendix with a GIA stapler. This was placed in a bag and removed. I viewed the staple line on the cecum and this appears to be intact and viable. There was no injury to cecum or small bowel noted. I then obtained hemostasis and irrigated.I removed the hassontrocar. I tied my pursestring down and added 2 0vicryl stitchesat the umbilical incision with the endoclose device. I then desufflated the abdomen and removed all my remaining trocars. I then closed these with 4-0 Monocryl and Dermabond. She tolerated this well was extubated and transferred to the recovery room in stable condition

## 2016-12-04 NOTE — Transfer of Care (Signed)
Immediate Anesthesia Transfer of Care Note  Patient: Ica Daye  Procedure(s) Performed: Procedure(s): APPENDECTOMY LAPAROSCOPIC (N/A)  Patient Location: PACU  Anesthesia Type:General  Level of Consciousness: awake, alert , oriented and patient cooperative  Airway & Oxygen Therapy: Patient Spontanous Breathing and Patient connected to nasal cannula oxygen  Post-op Assessment: Report given to RN, Post -op Vital signs reviewed and stable and Patient moving all extremities  Post vital signs: Reviewed and stable  Last Vitals:  Vitals:   12/04/16 2128 12/04/16 2248  BP: (!) 143/72   Pulse: 64 64  Resp: 16   Temp: 36.7 C (!) 36.2 C  SpO2: 99%     Last Pain:  Vitals:   12/04/16 2128  TempSrc: Oral  PainSc: 8          Complications: No apparent anesthesia complications

## 2016-12-04 NOTE — Progress Notes (Signed)
Received patient from PACU.  Patient AOx4, VS stable, pain at 6/10 from lap appe surgery, voided in PACU per PACU RN and O2Sat at 100% on RA.  Patient oriented to room, bed controls and call.  Administered scheduled Tylenol 1000 mg per order.  Gave ice water to drink.  Will monitor.

## 2016-12-04 NOTE — Anesthesia Preprocedure Evaluation (Addendum)
Anesthesia Evaluation  Patient identified by MRN, date of birth, ID band Patient awake    Reviewed: Allergy & Precautions, NPO status , Patient's Chart, lab work & pertinent test results  History of Anesthesia Complications Negative for: history of anesthetic complications  Airway Mallampati: I  TM Distance: >3 FB Neck ROM: Full    Dental  (+) Teeth Intact, Dental Advisory Given   Pulmonary neg pulmonary ROS,    breath sounds clear to auscultation       Cardiovascular negative cardio ROS   Rhythm:Regular Rate:Normal     Neuro/Psych negative neurological ROS     GI/Hepatic Neg liver ROS, Diarrhea with acute appy   Endo/Other  negative endocrine ROS  Renal/GU negative Renal ROS     Musculoskeletal negative musculoskeletal ROS (+)   Abdominal   Peds  Hematology negative hematology ROS (+)   Anesthesia Other Findings   Reproductive/Obstetrics Post menopausal                             Anesthesia Physical Anesthesia Plan  ASA: II and emergent  Anesthesia Plan: General   Post-op Pain Management:    Induction: Intravenous  PONV Risk Score and Plan: 4 or greater and Ondansetron, Dexamethasone, Midazolam and Scopolamine patch - Pre-op  Airway Management Planned: Oral ETT  Additional Equipment:   Intra-op Plan:   Post-operative Plan: Extubation in OR  Informed Consent: I have reviewed the patients History and Physical, chart, labs and discussed the procedure including the risks, benefits and alternatives for the proposed anesthesia with the patient or authorized representative who has indicated his/her understanding and acceptance.   Dental advisory given  Plan Discussed with: CRNA and Surgeon  Anesthesia Plan Comments: (Plan routine monitors, GETA)       Anesthesia Quick Evaluation

## 2016-12-04 NOTE — ED Triage Notes (Addendum)
Pt presents with c/o right lower abdominal pain. The pain began about two days ago. The pain is worse with palpation. She reports fevers, anorexia, diarrhea. She has not tried anything at home for her symptoms.

## 2016-12-05 ENCOUNTER — Encounter (HOSPITAL_COMMUNITY): Payer: Self-pay | Admitting: General Surgery

## 2016-12-05 LAB — URINALYSIS, ROUTINE W REFLEX MICROSCOPIC
Bacteria, UA: NONE SEEN
Bilirubin Urine: NEGATIVE
Glucose, UA: NEGATIVE mg/dL
Hgb urine dipstick: NEGATIVE
Ketones, ur: NEGATIVE mg/dL
Nitrite: NEGATIVE
Protein, ur: 30 mg/dL — AB
Specific Gravity, Urine: >1.046 — ABNORMAL HIGH (ref 1.005–1.030)
pH: 5 (ref 5.0–8.0)

## 2016-12-05 MED ORDER — OXYCODONE HCL 5 MG PO TABS: 5.0000 mg | ORAL_TABLET | ORAL | 0 refills | Status: DC | PRN

## 2016-12-05 NOTE — Discharge Instructions (Signed)
[facility] LAPAROSCOP[facility] SURGERY: POST OP INSTRUCTIONS  Always review your discharge instruction sheet given to you by the facility where your surgery was performed. IF YOU HAVE DISABILITY OR FAMILY LEAVE FORMS, YOU MUST BRING THEM TO THE OFFICE FOR PROCESSING.   DO NOT GIVE THEM TO YOUR DOCTOR.  1. A prescription for pain medication may be given to you upon discharge.  Take your pain medication as prescribed, if needed.  If narcotic pain medicine is not needed, then you may take acetaminophen (Tylenol), naprosyn (Alleve), or ibuprofen (Advil) as needed. 2. Take your usually prescribed medications unless otherwise directed. 3. If you need a refill on your pain medication, please contact your pharmacy.  They will contact our office to request authorization. Prescriptions will not be filled after 5pm or on week-ends. 4. You should follow a light diet the first few days after arrival home, such as soup and crackers, etc.  Be sure to include lots of fluids daily. 5. Most patients will experience some swelling and bruising in the area of the incisions.  Ice packs will help.  Swelling and bruising can take several days to resolve.  6. It is common to experience some constipation if taking pain medication after surgery.  Increasing fluid intake and taking a stool softener (such as Colace) will usually help or prevent this problem from occurring.  A mild laxative (Milk of Magnesia or Miralax) should be taken according to package instructions if there are no bowel movements after 48 hours. 7. Unless discharge instructions indicate otherwise, you may remove your bandages 48 hours after surgery, and you may shower at that time.  You may have steri-strips (small skin tapes) in place directly over the incision.  These strips should be left on the skin for 7-10 days.  If your surgeon used skin glue on the incision, you may shower in 24 hours.  The glue will flake  off over the next 2-3 weeks.  Any sutures or staples will be removed at the office during your follow-up visit. 8. ACTIVITIES:  You may resume regular (light) daily activities beginning the next day--such as daily self-care, walking, climbing stairs--gradually increasing activities as tolerated.  You may have sexual intercourse when it is comfortable.  Refrain from any heavy lifting or straining until approved by your doctor. a. You may drive when you are no longer taking prescription pain medication, you can comfortably wear a seatbelt, and you can safely maneuver your car and apply brakes. b. RETURN TO WORK:  __________________________________________________________ 9. You should see your doctor in the office for a follow-up appointment approximately 2-3 weeks after your surgery.  Make sure that you call for this appointment within a day or two after you arrive home to insure a convenient appointment time. 10. OTHER INSTRUCTIONS: __________________________________________________________________________________________________________________________ __________________________________________________________________________________________________________________________ WHEN TO CALL YOUR DOCTOR: 1. Fever over 101.0 2. Inability to urinate 3. Continued bleeding from incision. 4. Increased pain, redness, or drainage from the incision. 5. Increasing abdominal pain  The clinic staff is available to answer your questions during regular business hours.  Please don't hesitate to call and ask to speak to one of the nurses for clinical concerns.  If you have a medical emergency, go to the nearest emergency room or call 911.  A surgeon from [facility] is always on call at the hospital. [address], [address], [place], [place]  [address] ? [address] [address], , [place]   [address] [phone] ? [phone] ? [phone]   [phone] Web site: [email or web address]  

## 2016-12-05 NOTE — Progress Notes (Signed)
1 Day Post-Op   Subjective/Chief Complaint: Sore at umbilicus has voided tol liquids, some nausea with sitting up   Objective: Vital signs in last 24 hours: Temp:  [97.2 F (36.2 C)-98.5 F (36.9 C)] 98.5 F (36.9 C) (08/15 0442) Pulse Rate:  [59-81] 63 (08/15 0610) Resp:  [15-18] 18 (08/15 0610) BP: (90-155)/(47-88) 92/54 (08/15 0610) SpO2:  [98 %-100 %] 98 % (08/15 0610) Weight:  [74.4 kg (164 lb)] 74.4 kg (164 lb) (08/14 1751) Last BM Date: 12/04/16  Intake/Output from previous day: 08/14 0701 - 08/15 0700 In: 1238.3 [P.O.:360; I.V.:778.3; IV Piggyback:100] Out: 250 [Urine:200; Blood:50] Intake/Output this shift: No intake/output data recorded.  GI: soft approp tender incisions clean  Lab Results:   Recent Labs  12/04/16 1840  WBC 9.0  HGB 12.1  HCT 36.1  PLT 209   BMET  Recent Labs  12/04/16 1840  NA 132*  K 3.1*  CL 98*  CO2 28  GLUCOSE 91  BUN 12  CREATININE 0.85  CALCIUM 8.8*   PT/INR No results for input(s): LABPROT, INR in the last 72 hours. ABG No results for input(s): PHART, HCO3 in the last 72 hours.  Invalid input(s): PCO2, PO2  Studies/Results: Ct Abdomen Pelvis W Contrast  Result Date: 12/04/2016 CLINICAL DATA:  Abdominal pain for 2-3 days with nausea and vomiting EXAM: CT ABDOMEN AND PELVIS WITH CONTRAST TECHNIQUE: Multidetector CT imaging of the abdomen and pelvis was performed using the standard protocol following bolus administration of intravenous contrast. CONTRAST:  169mL ISOVUE-300 IOPAMIDOL (ISOVUE-300) INJECTION 61% COMPARISON:  Report 12/30/2001 FINDINGS: Lower chest: Lung bases demonstrate no acute consolidation or pleural effusion. Normal heart size. Hepatobiliary: Multiple subcentimeter hypodensities in the liver, too small to further characterize, probably cysts. No calcified gallstones or biliary dilatation Pancreas: Unremarkable. No pancreatic ductal dilatation or surrounding inflammatory changes. Spleen: Normal in size  without focal abnormality. Adrenals/Urinary Tract: Adrenal glands are unremarkable. Kidneys are normal, without renal calculi, focal lesion, or hydronephrosis. Bladder is unremarkable. Stomach/Bowel: Stomach within normal limits. No dilated small bowel. Irregular thickening of the cecum. Enlarged appendix, measuring up to 13 mm in size. Mild edema and haziness of the surrounding fat in the right lower quadrant. Vascular/Lymphatic: No significant vascular findings are present. No enlarged abdominal or pelvic lymph nodes. Reproductive: Status post hysterectomy. No adnexal masses. Other: Negative for free air or free fluid Musculoskeletal: Degenerative changes. No acute or suspicious bone lesion. IMPRESSION: 1. Enlarged appendix up to 13 mm with mild edema/haziness in surrounding fat, concerning for an acute appendicitis. Negative for perforation or abscess. 2. There is wall thickening of the cecum, it is presumed that this is due to inflammation of the appendix 3. Subcentimeter liver hypodensities, too small to further characterize Electronically Signed   By: Donavan Foil M.D.   On: 12/04/2016 20:13    Anti-infectives: Anti-infectives    Start     Dose/Rate Route Frequency Ordered Stop   12/05/16 1800  cefTRIAXone (ROCEPHIN) 2 g in dextrose 5 % 50 mL IVPB     2 g 100 mL/hr over 30 Minutes Intravenous Every 24 hours 12/04/16 2334     12/05/16 0400  metroNIDAZOLE (FLAGYL) IVPB 500 mg     500 mg 100 mL/hr over 60 Minutes Intravenous Every 8 hours 12/04/16 2334     12/04/16 2030  cefTRIAXone (ROCEPHIN) 2 g in dextrose 5 % 50 mL IVPB     2 g 100 mL/hr over 30 Minutes Intravenous  Once 12/04/16 2027 12/04/16 2123  12/04/16 2030  metroNIDAZOLE (FLAGYL) IVPB 500 mg     500 mg 100 mL/hr over 60 Minutes Intravenous  Once 12/04/16 2027 12/04/16 2359      Assessment/Plan: POD 1 lap appy  Will check later today for discharge No need for abx at home Will see back in office in 2  weeks  [facility] 12/05/2016

## 2016-12-06 NOTE — Discharge Summary (Signed)
[facility] Discharge Summary   Patient ID: Wanda Duke MRN: [id number] DOB/AGE: [birth year] 56 y.o.  Admit date: 12/04/2016 Discharge date: 12/05/2016  Admitting Diagnosis: Acute appendicitis  Discharge Diagnosis S/P appendectomy  Consultants None   Imaging: Ct Abdomen Pelvis W Contrast  Result Date: 12/04/2016 CLINICAL DATA:  Abdominal pain for 2-3 days with nausea and vomiting EXAM: CT ABDOMEN AND PELVIS WITH CONTRAST TECHNIQUE: Multidetector CT imaging of the abdomen and pelvis was performed using the standard protocol following bolus administration of intravenous contrast. CONTRAST:  159mL ISOVUE-300 IOPAMIDOL (ISOVUE-300) INJECTION 61% COMPARISON:  Report 12/30/2001 FINDINGS: Lower chest: Lung bases demonstrate no acute consolidation or pleural effusion. Normal heart size. Hepatobiliary: Multiple subcentimeter hypodensities in the liver, too small to further characterize, probably cysts. No calcified gallstones or biliary dilatation Pancreas: Unremarkable. No pancreatic ductal dilatation or surrounding inflammatory changes. Spleen: Normal in size without focal abnormality. Adrenals/Urinary Tract: Adrenal glands are unremarkable. Kidneys are normal, without renal calculi, focal lesion, or hydronephrosis. Bladder is unremarkable. Stomach/Bowel: Stomach within normal limits. No dilated small bowel. Irregular thickening of the cecum. Enlarged appendix, measuring up to 13 mm in size. Mild edema and haziness of the surrounding fat in the right lower quadrant. Vascular/Lymphatic: No significant vascular findings are present. No enlarged abdominal or pelvic lymph nodes. Reproductive: Status post hysterectomy. No adnexal masses. Other: Negative for free air or free fluid Musculoskeletal: Degenerative changes. No acute or suspicious bone lesion. IMPRESSION: 1. Enlarged appendix up to 13 mm with mild edema/haziness in surrounding fat, concerning for an acute appendicitis. Negative for  perforation or abscess. 2. There is wall thickening of the cecum, it is presumed that this is due to inflammation of the appendix 3. Subcentimeter liver hypodensities, too small to further characterize Electronically Signed   By: Donavan Foil M.D.   On: 12/04/2016 20:13    Procedures Dr. Donne Hazel (12/04/16) - Laparoscopic Appendectomy  Hospital Course:  Patient is a 56 y/o female who presented to [facility] with abdominal pain.  Workup showed acute appendicitis.  Patient was admitted and underwent procedure listed above.  Tolerated procedure well and was transferred to the floor.  Diet was advanced as tolerated.  On POD#1, the patient was voiding well, tolerating diet, ambulating well, pain well controlled, vital signs stable, incisions c/d/i and felt stable for discharge home.  Patient will follow up in our office in 2 weeks and knows to call with questions or concerns. She will call to confirm appointment date/time.     Allergies as of 12/05/2016      Reactions   Erythromycin Nausea Only      Medication List    TAKE these medications   benzonatate 100 MG capsule Commonly known as:  TESSALON PERLES Take 1-2 capsules (100-200 mg total) by mouth every 8 (eight) hours as needed for cough.   naproxen sodium 220 MG tablet Commonly known as:  ANAPROX Take 220-440 mg by mouth 2 (two) times daily as needed (for pain).   oseltamivir 75 MG capsule Commonly known as:  TAMIFLU Take 1 capsule (75 mg total) by mouth 2 (two) times daily.   oxyCODONE 5 MG immediate release tablet Commonly known as:  Oxy IR/ROXICODONE Take 1 tablet (5 mg total) by mouth every 4 (four) hours as needed for moderate pain.        Follow-up Information    Rolm Bookbinder, MD. Call in 2 week(s).   Specialty:  General Surgery Why:  Call and make an appointment to follow up in 2 weeks.  Contact information: [address] [place] [address] [phone]           Signed: Brigid Re ,  [facility] 12/06/2016, 4:24 PM Pager: [phone] Mon-Fri 7:00 am-4:30 pm Sat-Sun 7:00 am-11:30 am

## 2016-12-12 ENCOUNTER — Emergency Department (HOSPITAL_COMMUNITY): Payer: BLUE CROSS/BLUE SHIELD

## 2016-12-12 ENCOUNTER — Encounter (HOSPITAL_COMMUNITY): Payer: Self-pay

## 2016-12-12 ENCOUNTER — Observation Stay (HOSPITAL_COMMUNITY)
Admission: EM | Admit: 2016-12-12 | Discharge: 2016-12-14 | Disposition: A | Discharge status: Home / Self Care | Payer: BLUE CROSS/BLUE SHIELD | Attending: General Surgery | Admitting: General Surgery

## 2016-12-12 DIAGNOSIS — Z9049 Acquired absence of other specified parts of digestive tract: Secondary | ICD-10-CM | POA: Diagnosis not present

## 2016-12-12 DIAGNOSIS — S301XXA Contusion of abdominal wall, initial encounter: Secondary | ICD-10-CM | POA: Insufficient documentation

## 2016-12-12 DIAGNOSIS — Y838 Other surgical procedures as the cause of abnormal reaction of the patient, or of later complication, without mention of misadventure at the time of the procedure: Secondary | ICD-10-CM | POA: Diagnosis not present

## 2016-12-12 DIAGNOSIS — K661 Hemoperitoneum: Secondary | ICD-10-CM | POA: Diagnosis not present

## 2016-12-12 DIAGNOSIS — R109 Unspecified abdominal pain: Secondary | ICD-10-CM | POA: Diagnosis not present

## 2016-12-12 HISTORY — DX: Hemorrhagic condition, unspecified: D69.9

## 2016-12-12 LAB — CBC WITH DIFFERENTIAL/PLATELET
BASOS ABS: 0 10*3/uL (ref 0.0–0.1)
BASOS PCT: 0 %
EOS ABS: 0.2 10*3/uL (ref 0.0–0.7)
EOS PCT: 2 %
HEMATOCRIT: 24.9 % — AB (ref 36.0–46.0)
Hemoglobin: 8.3 g/dL — ABNORMAL LOW (ref 12.0–15.0)
Lymphocytes Relative: 20 %
Lymphs Abs: 1.9 10*3/uL (ref 0.7–4.0)
MCH: 29.2 pg (ref 26.0–34.0)
MCHC: 33.3 g/dL (ref 30.0–36.0)
MCV: 87.7 fL (ref 78.0–100.0)
MONO ABS: 0.9 10*3/uL (ref 0.1–1.0)
Monocytes Relative: 10 %
NEUTROS ABS: 6.4 10*3/uL (ref 1.7–7.7)
Neutrophils Relative %: 68 %
PLATELETS: 367 10*3/uL (ref 150–400)
RBC: 2.84 MIL/uL — ABNORMAL LOW (ref 3.87–5.11)
RDW: 13.8 % (ref 11.5–15.5)
WBC: 9.4 10*3/uL (ref 4.0–10.5)

## 2016-12-12 LAB — BASIC METABOLIC PANEL
Anion gap: 9 (ref 5–15)
BUN: 9 mg/dL (ref 6–20)
CALCIUM: 9.4 mg/dL (ref 8.9–10.3)
CO2: 24 mmol/L (ref 22–32)
Chloride: 102 mmol/L (ref 101–111)
Creatinine, Ser: 0.69 mg/dL (ref 0.44–1.00)
GFR calc non Af Amer: >60 mL/min (ref >60)
Glucose, Bld: 100 mg/dL — ABNORMAL HIGH (ref 65–99)
Potassium: 4.4 mmol/L (ref 3.5–5.1)
Sodium: 135 mmol/L (ref 135–145)

## 2016-12-12 LAB — CBC
HEMATOCRIT: 25.5 % — AB (ref 36.0–46.0)
HEMOGLOBIN: 8.3 g/dL — AB (ref 12.0–15.0)
MCH: 28.7 pg (ref 26.0–34.0)
MCHC: 32.5 g/dL (ref 30.0–36.0)
MCV: 88.2 fL (ref 78.0–100.0)
Platelets: 361 10*3/uL (ref 150–400)
RBC: 2.89 MIL/uL — ABNORMAL LOW (ref 3.87–5.11)
RDW: 13.8 % (ref 11.5–15.5)
WBC: 8.1 10*3/uL (ref 4.0–10.5)

## 2016-12-12 LAB — I-STAT CHEM 8, ED
BUN: 10 mg/dL (ref 6–20)
Calcium, Ion: 1.16 mmol/L (ref 1.15–1.40)
Chloride: 101 mmol/L (ref 101–111)
Creatinine, Ser: 0.7 mg/dL (ref 0.44–1.00)
Glucose, Bld: 98 mg/dL (ref 65–99)
HEMATOCRIT: 24 % — AB (ref 36.0–46.0)
HEMOGLOBIN: 8.2 g/dL — AB (ref 12.0–15.0)
POTASSIUM: 4.5 mmol/L (ref 3.5–5.1)
SODIUM: 135 mmol/L (ref 135–145)
TCO2: 26 mmol/L (ref 0–100)

## 2016-12-12 LAB — URINALYSIS, ROUTINE W REFLEX MICROSCOPIC
BACTERIA UA: NONE SEEN
BILIRUBIN URINE: NEGATIVE
Glucose, UA: NEGATIVE mg/dL
KETONES UR: NEGATIVE mg/dL
LEUKOCYTES UA: NEGATIVE
NITRITE: NEGATIVE
PH: 6 (ref 5.0–8.0)
Protein, ur: 100 mg/dL — AB
SPECIFIC GRAVITY, URINE: 1.009 (ref 1.005–1.030)
SQUAMOUS EPITHELIAL / LPF: NONE SEEN

## 2016-12-12 LAB — PROTIME-INR
INR: 1.04
Prothrombin Time: 13.6 seconds (ref 11.4–15.2)

## 2016-12-12 LAB — I-STAT CG4 LACTIC ACID, ED: LACTIC ACID, VENOUS: 1.22 mmol/L (ref 0.5–1.9)

## 2016-12-12 MED ORDER — ONDANSETRON HCL 4 MG/2ML IJ SOLN: 4.0000 mg | Freq: Four times a day (QID) | INTRAMUSCULAR | Status: DC | PRN

## 2016-12-12 MED ORDER — ONDANSETRON 4 MG PO TBDP: 4.0000 mg | ORAL_TABLET | Freq: Four times a day (QID) | ORAL | Status: DC | PRN

## 2016-12-12 MED ORDER — METHOCARBAMOL 500 MG PO TABS
500.0000 mg | ORAL_TABLET | Freq: Four times a day (QID) | ORAL | Status: DC | PRN
  Administered 2016-12-13: 500 mg via ORAL
  Filled 2016-12-12: qty 1

## 2016-12-12 MED ORDER — IOPAMIDOL (ISOVUE-300) INJECTION 61%
INTRAVENOUS | Status: AC
  Administered 2016-12-12: 100 mL
  Filled 2016-12-12: qty 100

## 2016-12-12 MED ORDER — SODIUM CHLORIDE 0.9 % IV SOLN
INTRAVENOUS | Status: DC
  Administered 2016-12-13: 02:00:00 via INTRAVENOUS

## 2016-12-12 MED ORDER — OXYCODONE HCL 5 MG PO TABS
5.0000 mg | ORAL_TABLET | ORAL | Status: DC | PRN
  Administered 2016-12-12 – 2016-12-13 (×3): 10 mg via ORAL
  Filled 2016-12-12 (×3): qty 2

## 2016-12-12 MED ORDER — MORPHINE SULFATE (PF) 4 MG/ML IV SOLN
2.0000 mg | INTRAVENOUS | Status: DC | PRN
  Administered 2016-12-12: 2 mg via INTRAVENOUS
  Filled 2016-12-12: qty 1

## 2016-12-12 MED ORDER — ACETAMINOPHEN 325 MG PO TABS
650.0000 mg | ORAL_TABLET | Freq: Four times a day (QID) | ORAL | Status: DC | PRN
  Administered 2016-12-13: 650 mg via ORAL
  Filled 2016-12-12: qty 2

## 2016-12-12 MED ORDER — ACETAMINOPHEN 650 MG RE SUPP: 650.0000 mg | Freq: Four times a day (QID) | RECTAL | Status: DC | PRN

## 2016-12-12 NOTE — ED Notes (Signed)
Attempted report x1. 

## 2016-12-12 NOTE — ED Notes (Signed)
Pt ambulatory to restroom with steady gait.

## 2016-12-12 NOTE — ED Provider Notes (Signed)
[facility] DEPT Provider Note   CSN: [id number] Arrival date & time: 12/12/16  1237     History   Chief Complaint Chief Complaint  Patient presents with  . Abdominal Pain    HPI Wanda Duke is a 56 y.o. female.  Patient is a 56 year old female with no significant past medical history. She presents for evaluation of right lower quadrant pain. One week ago, she underwent an uncomplicated laparoscopic appendectomy for acute appendicitis. She was initially healing well until approximately 2 days ago when she developed increasing pain and low-grade fever. She was seen at the surgery clinic this morning, then sent here for CT scan to rule out abscess.   The history is provided by the patient.  Abdominal Pain   This is a new problem. The current episode started 2 days ago. The problem occurs constantly. The problem has been gradually worsening. The pain is located in the RLQ. The quality of the pain is sharp. The pain is moderate. Associated symptoms include fever. Pertinent negatives include melena, nausea, vomiting and dysuria. The symptoms are aggravated by certain positions (Ambulating). The symptoms are relieved by certain positions.    History reviewed. No pertinent past medical history.  Patient Active Problem List   Diagnosis Date Noted  . Appendicitis 12/04/2016    Past Surgical History:  Procedure Laterality Date  . APPENDECTOMY    . CESAREAN SECTION     x2  . HYSTEROTOMY    . LAPAROSCOPIC APPENDECTOMY N/A 12/04/2016   Procedure: APPENDECTOMY LAPAROSCOPIC;  Surgeon: Rolm Bookbinder, MD;  Location: [facility];  Service: General;  Laterality: N/A;  . LAPAROSCOPY     for endometriosis  . WISDOM TOOTH EXTRACTION      OB History    No data available       Home Medications    Prior to Admission medications   Medication Sig Start Date End Date Taking? Authorizing Provider  benzonatate (TESSALON PERLES) 100 MG capsule Take 1-2 capsules (100-200 mg total) by  mouth every 8 (eight) hours as needed for cough. Patient not taking: Reported on 12/04/2016 05/06/16   Withrow, Elyse Jarvis, FNP  naproxen sodium (ANAPROX) 220 MG tablet Take 220-440 mg by mouth 2 (two) times daily as needed (for pain).    [provider]  oseltamivir (TAMIFLU) 75 MG capsule Take 1 capsule (75 mg total) by mouth 2 (two) times daily. Patient not taking: Reported on 12/04/2016 05/06/16   Benjamine Mola, FNP  oxyCODONE (OXY IR/ROXICODONE) 5 MG immediate release tablet Take 1 tablet (5 mg total) by mouth every 4 (four) hours as needed for moderate pain. 12/05/16   Rolm Bookbinder, MD    Family History Family History  Problem Relation Age of Onset  . Colon cancer Neg Hx   . Colon polyps Neg Hx   . Rectal cancer Neg Hx   . Stomach cancer Neg Hx     Social History Social History  Substance Use Topics  . Smoking status: Never Smoker  . Smokeless tobacco: Never Used  . Alcohol use 0.0 oz/week     Comment: rare      Allergies   Erythromycin   Review of Systems Review of Systems  Constitutional: Positive for fever.  Gastrointestinal: Positive for abdominal pain. Negative for melena, nausea and vomiting.  Genitourinary: Negative for dysuria.  All other systems reviewed and are negative.    Physical Exam Updated Vital Signs BP (!) 148/82 (BP Location: Right Arm)   Pulse 72   Temp 98.3  F (36.8 C) (Oral)   Resp 17   Ht 5\' 4"  (1.626 m)   Wt 72.6 kg (160 lb)   SpO2 100%   BMI 27.46 kg/m   Physical Exam  Constitutional: She is oriented to person, place, and time. She appears well-developed and well-nourished. No distress.  HENT:  Head: Normocephalic and atraumatic.  Neck: Normal range of motion. Neck supple.  Cardiovascular: Normal rate and regular rhythm.  Exam reveals no gallop and no friction rub.   No murmur heard. Pulmonary/Chest: Effort normal and breath sounds normal. No respiratory distress. She has no wheezes.  Abdominal: Soft. Bowel sounds are  normal. She exhibits no distension. There is tenderness.  There is tenderness to palpation in the right lower quadrant. There is no rebound or guarding.  Musculoskeletal: Normal range of motion.  Neurological: She is alert and oriented to person, place, and time.  Skin: Skin is warm and dry. She is not diaphoretic.  Nursing note and vitals reviewed.    ED Treatments / Results  Labs (all labs ordered are listed, but only abnormal results are displayed) Labs Reviewed  CBC WITH DIFFERENTIAL/PLATELET - Abnormal; Notable for the following:       Result Value   RBC 2.84 (*)    Hemoglobin 8.3 (*)    HCT 24.9 (*)    All other components within normal limits  I-STAT CHEM 8, ED - Abnormal; Notable for the following:    Hemoglobin 8.2 (*)    HCT 24.0 (*)    All other components within normal limits  BASIC METABOLIC PANEL  URINALYSIS, ROUTINE W REFLEX MICROSCOPIC  I-STAT CG4 LACTIC ACID, ED    EKG  EKG Interpretation None       Radiology No results found.  Procedures Procedures (including critical care time)  Medications Ordered in ED Medications - No data to display   Initial Impression / Assessment and Plan / ED Course  I have reviewed the triage vital signs and the nursing notes.  Pertinent labs & imaging results that were available during my care of the patient were reviewed by me and considered in my medical decision making (see chart for details).  CT scan shows what appears to be blood in the pelvis in the area of the appendectomy. She has also had a 4 g drop in hemoglobin since the surgery. This has been discussed with Dr. Donne Hazel who will evaluate and admit the patient.  Final Clinical Impressions(s) / ED Diagnoses   Final diagnoses:  None    New Prescriptions New Prescriptions   No medications on file     Veryl Speak, MD 12/12/16 [phone]

## 2016-12-12 NOTE — H&P (Signed)
Wanda Duke is an 56 y.o. female.   Chief Complaint: diarrhea, ab pain HPI: 9 yof pod 8 from lap appy for acute suppurative appendicitis. Was unremarkable surgery. Went home pod one.  She has noted bruising on abdominal wall that has been evolving. Then on Sunday had some rlq and right sided pain.  Having multiple loose stools as well.  Some dry heaves.  No fevers.  She has no energy. She was referred to er for evaluation  History reviewed. No pertinent past medical history.  Past Surgical History:  Procedure Laterality Date  . APPENDECTOMY    . CESAREAN SECTION     x2  . HYSTEROTOMY    . LAPAROSCOPIC APPENDECTOMY N/A 12/04/2016   Procedure: APPENDECTOMY LAPAROSCOPIC;  Surgeon: Rolm Bookbinder, MD;  Location: [facility];  Service: General;  Laterality: N/A;  . LAPAROSCOPY     for endometriosis  . WISDOM TOOTH EXTRACTION      Family History  Problem Relation Age of Onset  . Colon cancer Neg Hx   . Colon polyps Neg Hx   . Rectal cancer Neg Hx   . Stomach cancer Neg Hx    Social History:  reports that she has never smoked. She has never used smokeless tobacco. She reports that she drinks alcohol. She reports that she does not use drugs.  Allergies:  Allergies  Allergen Reactions  . Erythromycin Nausea Only   meds reviewed  Results for orders placed or performed during the hospital encounter of 12/12/16 (from the past 48 hour(s))  CBC with Differential     Status: Abnormal   Collection Time: 12/12/16 12:45 PM  Result Value Ref Range   WBC 9.4 4.0 - 10.5 K/uL   RBC 2.84 (L) 3.87 - 5.11 MIL/uL   Hemoglobin 8.3 (L) 12.0 - 15.0 g/dL   HCT 24.9 (L) 36.0 - 46.0 %   MCV 87.7 78.0 - 100.0 fL   MCH 29.2 26.0 - 34.0 pg   MCHC 33.3 30.0 - 36.0 g/dL   RDW 13.8 11.5 - 15.5 %   Platelets 367 150 - 400 K/uL   Neutrophils Relative % 68 %   Neutro Abs 6.4 1.7 - 7.7 K/uL   Lymphocytes Relative 20 %   Lymphs Abs 1.9 0.7 - 4.0 K/uL   Monocytes Relative 10 %   Monocytes Absolute 0.9 0.1  - 1.0 K/uL   Eosinophils Relative 2 %   Eosinophils Absolute 0.2 0.0 - 0.7 K/uL   Basophils Relative 0 %   Basophils Absolute 0.0 0.0 - 0.1 K/uL  Basic metabolic panel     Status: Abnormal   Collection Time: 12/12/16 12:45 PM  Result Value Ref Range   Sodium 135 135 - 145 mmol/L   Potassium 4.4 3.5 - 5.1 mmol/L   Chloride 102 101 - 111 mmol/L   CO2 24 22 - 32 mmol/L   Glucose, Bld 100 (H) 65 - 99 mg/dL   BUN 9 6 - 20 mg/dL   Creatinine, Ser 0.69 0.44 - 1.00 mg/dL    Comment: ICTERIC SPECIMEN   Calcium 9.4 8.9 - 10.3 mg/dL   GFR calc non Af Amer >60 >60 mL/min   GFR calc Af Amer >60 >60 mL/min    Comment: (NOTE) The eGFR has been calculated using the CKD EPI equation. This calculation has not been validated in all clinical situations. eGFR's persistently <60 mL/min signify possible Chronic Kidney Disease.    Anion gap 9 5 - 15  I-Stat Chem 8, ED  Status: Abnormal   Collection Time: 12/12/16  1:14 PM  Result Value Ref Range   Sodium 135 135 - 145 mmol/L   Potassium 4.5 3.5 - 5.1 mmol/L   Chloride 101 101 - 111 mmol/L   BUN 10 6 - 20 mg/dL   Creatinine, Ser 0.70 0.44 - 1.00 mg/dL   Glucose, Bld 98 65 - 99 mg/dL   Calcium, Ion 1.16 1.15 - 1.40 mmol/L   TCO2 26 0 - 100 mmol/L   Hemoglobin 8.2 (L) 12.0 - 15.0 g/dL   HCT 24.0 (L) 36.0 - 46.0 %  I-Stat CG4 Lactic Acid, ED     Status: None   Collection Time: 12/12/16  1:15 PM  Result Value Ref Range   Lactic Acid, Venous 1.22 0.5 - 1.9 mmol/L  Urinalysis, Routine w reflex microscopic     Status: Abnormal   Collection Time: 12/12/16  2:27 PM  Result Value Ref Range   Color, Urine YELLOW YELLOW   APPearance CLEAR CLEAR   Specific Gravity, Urine 1.009 1.005 - 1.030   pH 6.0 5.0 - 8.0   Glucose, UA NEGATIVE NEGATIVE mg/dL   Hgb urine dipstick LARGE (A) NEGATIVE   Bilirubin Urine NEGATIVE NEGATIVE   Ketones, ur NEGATIVE NEGATIVE mg/dL   Protein, ur 100 (A) NEGATIVE mg/dL   Nitrite NEGATIVE NEGATIVE   Leukocytes, UA  NEGATIVE NEGATIVE   RBC / HPF 0-5 0 - 5 RBC/hpf   WBC, UA 0-5 0 - 5 WBC/hpf   Bacteria, UA NONE SEEN NONE SEEN   Squamous Epithelial / LPF NONE SEEN NONE SEEN   Ct Abdomen Pelvis W Contrast  Addendum Date: 12/12/2016   ADDENDUM REPORT: 12/12/2016 15:20 ADDENDUM: Study discussed by telephone with Dr. Veryl Speak on 12/12/2016 at 1508 hours. He advises a significant drop in hemoglobin since the time of surgery, consistent with postoperative bleeding and hemoperitoneum. Electronically Signed   By: Genevie Ann M.D.   On: 12/12/2016 15:20   Result Date: 12/12/2016 CLINICAL DATA:  56 year old female with increasing right lower quadrant abdominal pain status post appendectomy last week. Fever for 2 days. EXAM: CT ABDOMEN AND PELVIS WITH CONTRAST TECHNIQUE: Multidetector CT imaging of the abdomen and pelvis was performed using the standard protocol following bolus administration of intravenous contrast. CONTRAST:  141m ISOVUE-300 IOPAMIDOL (ISOVUE-300) INJECTION 61% COMPARISON:  Preoperative CT Abdomen and Pelvis 12/04/2016. FINDINGS: Lower chest: Mildly lower lung volumes. No pleural effusion. Minor lung base atelectasis. No pericardial effusion. Hepatobiliary: Small volume of perihepatic free fluid. Otherwise stable and negative (occasional small hepatic low-density areas again noted, at least some of which are benign hemangiomas). Gallbladder and biliary tree within normal limits. Pancreas: Negative. Spleen: Negative. Adrenals/Urinary Tract: Normal adrenal glands. Bilateral renal enhancement and contrast excretion is within normal limits. Contrast opacifies both ureters normally to the urinary bladder. The bladder is unremarkable except for adjacent free fluid. Stomach/Bowel: It appears no oral contrast was administered. No pneumoperitoneum identified, but there is a moderate volume of intermediate density fluid tracking along the right gutter from the right liver to the lower quadrant and pelvis. Smaller similar  density fluid in the distal small bowel mesentery. No organized / rim enhancing fluid collection identified. Staple line along the inferior tip of the cecum lateral to the terminal ileum. The lateral wall of the right colon is obscured by the fluid. No definite cecal wall thickening. No dilated small bowel. The TI and ileal loops are mildly irregular. Decompressed stomach. Duodenum and jejunum are within normal limits. Decompressed  but somewhat irregular-appearing rectosigmoid colon which is bordered by fluid. Decompressed left colon and transverse colon have a more normal appearance. Vascular/Lymphatic: Major arterial structures in the abdomen and pelvis are patent. Portal venous system appears patent. Reproductive: Surgically absent. Chronic right adnexal region surgical clips appears stable. Other: Moderate volume of intermediate density fluid throughout the pelvis (series 4, image 70). Postoperative changes to the ventral abdominal wall with mild generalized soft tissue stranding. No ventral abdominal hernia or abdominal wall fluid collection identified. Musculoskeletal: Stable.  No acute osseous abnormality identified. IMPRESSION: 1. Moderate volume of intermediate density fluid tracking along the right gutter and into the right lower quadrant and pelvis raising the possibility of Hemoperitoneum. Correlate with hemoglobin/hematocrit. 2. No free air to suggest bowel perforation, and no organized or rim enhancing fluid collection to suggest abscess. 3. No bowel obstruction. Mild irregularity of distal small bowel loops, the right colon, and distal colon. 4. Expected postoperative changes to the ventral abdominal wall. Electronically Signed: By: Genevie Ann M.D. On: 12/12/2016 15:04    Review of Systems  Gastrointestinal: Positive for abdominal pain, diarrhea and nausea.  All other systems reviewed and are negative.   Blood pressure (!) 143/84, pulse 72, temperature 98.3 F (36.8 C), temperature source Oral,  resp. rate 17, height '5\' 4"'  (1.626 m), weight 72.6 kg (160 lb), SpO2 100 %. Physical Exam  cv rrr Lungs clear abd with trocar sites without infection, there are significant abdominal wall hematomas at at least two of three trocar sites spread out throughout lower abdomen, tender rlq to palpation  Assessment/Plan Hemoperitoneum, abd wall hematoma  She appears on ct scan and on her abdominal wall to have hematomas. Not sure she is actively bleeding at this time her hct is certainly lower indicating this has been occurring likely since surgery.  She has told me she might have a fh of vwd also.  This is odd that she has multiple areas of hematoma.  I will plan on admitting her to the hospital and checking serial hct as well as a pt/inr. She is going to check with family on vwd.  Hopefully she bled but is now ok  Rolm Bookbinder, MD 12/12/2016, 3:27 PM

## 2016-12-12 NOTE — ED Triage Notes (Signed)
Pt sent from [facility] sx center to rule out post appendectomy abscess. Pt had appendix removed last week and has been having pain, nausea, and diarrhea since Monday evening.

## 2016-12-12 NOTE — ED Notes (Signed)
busing noted on the abdomen

## 2016-12-13 LAB — VON WILLEBRAND PANEL
Coagulation Factor VIII: 252 % — ABNORMAL HIGH (ref 57–163)
Ristocetin Co-factor, Plasma: 148 % (ref 50–200)
Von Willebrand Antigen, Plasma: 212 % — ABNORMAL HIGH (ref 50–200)

## 2016-12-13 LAB — CBC
HEMATOCRIT: 24 % — AB (ref 36.0–46.0)
Hemoglobin: 7.8 g/dL — ABNORMAL LOW (ref 12.0–15.0)
MCH: 28.8 pg (ref 26.0–34.0)
MCHC: 32.5 g/dL (ref 30.0–36.0)
MCV: 88.6 fL (ref 78.0–100.0)
Platelets: 328 10*3/uL (ref 150–400)
RBC: 2.71 MIL/uL — AB (ref 3.87–5.11)
RDW: 14.1 % (ref 11.5–15.5)
WBC: 7.1 10*3/uL (ref 4.0–10.5)

## 2016-12-13 LAB — COAG STUDIES INTERP REPORT

## 2016-12-13 MED ORDER — HYDROCORTISONE 1 % EX CREA
TOPICAL_CREAM | CUTANEOUS | Status: DC | PRN
  Administered 2016-12-13: 20:00:00 via TOPICAL
  Filled 2016-12-13: qty 28

## 2016-12-13 MED ORDER — DOCUSATE SODIUM 100 MG PO CAPS
100.0000 mg | ORAL_CAPSULE | Freq: Two times a day (BID) | ORAL | Status: DC
  Filled 2016-12-13: qty 1

## 2016-12-13 MED ORDER — FERROUS SULFATE 325 (65 FE) MG PO TABS
325.0000 mg | ORAL_TABLET | Freq: Two times a day (BID) | ORAL | Status: DC
  Administered 2016-12-13 – 2016-12-14 (×2): 325 mg via ORAL
  Filled 2016-12-13 (×2): qty 1

## 2016-12-13 NOTE — Care Management Note (Signed)
Case Management Note  Patient Details  Name: Wanda Duke MRN: [id number] Date of Birth: [birth year]  Subjective/Objective:              Patient from home w family. S/P lap appy > 1 week ago with abd bruising and nausea.      Action/Plan:  CM will continue to follow for DC planning Expected Discharge Date:                  Expected Discharge Plan:  Home/Self Care  In-House Referral:     Discharge planning Services  CM Consult  Post Acute Care Choice:    Choice offered to:     DME Arranged:    DME Agency:     HH Arranged:    HH Agency:     Status of Service:  In process, will continue to follow  If discussed at Long Length of Stay Meetings, dates discussed:    Additional Comments:  Carles Collet, RN 12/13/2016, 11:58 AM

## 2016-12-13 NOTE — Progress Notes (Signed)
CC: abdominal pain  Subjective: Pt feels OK in bed, she has not been up much so she doesn't know how she will feel after getting up.   Objective: Vital signs in last 24 hours: Temp:  [98.2 F (36.8 C)-99.4 F (37.4 C)] 98.2 F (36.8 C) (08/23 0446) Pulse Rate:  [67-80] 80 (08/23 0446) Resp:  [15-17] 16 (08/23 0446) BP: (122-167)/(72-94) 122/78 (08/23 0446) SpO2:  [97 %-100 %] 97 % (08/23 0446) Weight:  [72.6 kg (160 lb)-78.3 kg (172 lb 9.6 oz)] 78.3 kg (172 lb 9.6 oz) (08/22 1852) Last BM Date: 12/12/16  nothing PO recorded 180 IV recorded Urine 1200 MAXIMUM TEMPERATURE 99.4 vital signs are stable. WBC 7.1 H/H: 7.8/24 down again. UA shows hemoglobin urine also. INR 1.04 Von Willebrand panel is pending. CT scan 12/12/16: moderate volume of intermediate density fluid tracking along the right gutter into the right lower quadrant pelvis with possibility of hemoperitoneum. No free air to suggest bowel perforation no organized Rim  fluid enhancing collection to suggest abscess. No bowel obstruction expected postoperative changes ventral abdominal wall. Intake/Output from previous day: 08/22 0701 - 08/23 0700 In: 180.8 [I.V.:180.8] Out: 1200 [Urine:1200] Intake/Output this shift: No intake/output data recorded.  General appearance: alert, cooperative and no distress Resp: clear to auscultation bilaterally GI: soft, few BS, port sites look fine, but she has much more ecchymosis than you would expect, especially going to the left side lower port.  No distension, no pain on palpation.  no nausea or vomiting on clear liquids.  Lab Results:   Recent Labs  12/12/16 1841 12/13/16 0553  WBC 8.1 7.1  HGB 8.3* 7.8*  HCT 25.5* 24.0*  PLT 361 328    BMET  Recent Labs  12/12/16 1245 12/12/16 1314  NA 135 135  K 4.4 4.5  CL 102 101  CO2 24  --   GLUCOSE 100* 98  BUN 9 10  CREATININE 0.69 0.70  CALCIUM 9.4  --    PT/INR  Recent Labs  12/12/16 1841  LABPROT 13.6   INR 1.04    No results for input(s): AST, ALT, ALKPHOS, BILITOT, PROT, ALBUMIN in the last 168 hours.   Lipase     Component Value Date/Time   LIPASE 29 12/04/2016 1840  ` Prior to Admission medications   Medication Sig Start Date End Date Taking? Authorizing Provider  oxyCODONE (OXY IR/ROXICODONE) 5 MG immediate release tablet Take 1 tablet (5 mg total) by mouth every 4 (four) hours as needed for moderate pain. 12/05/16  Yes Rolm Bookbinder, MD  benzonatate (TESSALON PERLES) 100 MG capsule Take 1-2 capsules (100-200 mg total) by mouth every 8 (eight) hours as needed for cough. Patient not taking: Reported on 12/04/2016 05/06/16   Withrow, Elyse Jarvis, FNP  naproxen sodium (ANAPROX) 220 MG tablet Take 220-440 mg by mouth 2 (two) times daily as needed (for pain).    [provider]  oseltamivir (TAMIFLU) 75 MG capsule Take 1 capsule (75 mg total) by mouth 2 (two) times daily. Patient not taking: Reported on 12/12/2016 05/06/16   Benjamine Mola, FNP      Medications:   . sodium chloride 50 mL/hr at 12/13/16 0146   Anti-infectives    None      Assessment/Plan Acute appendicitis with otoscopic appendectomy 12/04/16, Dr. Rolm Bookbinder. Postop hemoperitoneum/abdominal wall hematoma FEN:  IV fluids/clear liquids ID:  None DVT:  SCD  Plan:  Von Willebrand panel is still pending, hemoglobin and hematocrit are still trending down.  I will order repeat labs in AM along with a type and screen.       LOS: 0 days    Satoru Milich 12/13/2016 [phone]

## 2016-12-14 LAB — CBC
HCT: 26.2 % — ABNORMAL LOW (ref 36.0–46.0)
Hemoglobin: 8.7 g/dL — ABNORMAL LOW (ref 12.0–15.0)
MCH: 29.2 pg (ref 26.0–34.0)
MCHC: 33.2 g/dL (ref 30.0–36.0)
MCV: 87.9 fL (ref 78.0–100.0)
PLATELETS: 358 10*3/uL (ref 150–400)
RBC: 2.98 MIL/uL — AB (ref 3.87–5.11)
RDW: 14 % (ref 11.5–15.5)
WBC: 6.8 10*3/uL (ref 4.0–10.5)

## 2016-12-14 LAB — BASIC METABOLIC PANEL
Anion gap: 12 (ref 5–15)
BUN: 8 mg/dL (ref 6–20)
CALCIUM: 9.4 mg/dL (ref 8.9–10.3)
CO2: 23 mmol/L (ref 22–32)
CREATININE: 0.67 mg/dL (ref 0.44–1.00)
Chloride: 99 mmol/L — ABNORMAL LOW (ref 101–111)
GFR calc Af Amer: >60 mL/min (ref >60)
GLUCOSE: 86 mg/dL (ref 65–99)
Potassium: 3.7 mmol/L (ref 3.5–5.1)
Sodium: 134 mmol/L — ABNORMAL LOW (ref 135–145)

## 2016-12-14 LAB — ABO/RH: ABO/RH(D): O POS

## 2016-12-14 LAB — TYPE AND SCREEN
ABO/RH(D): O POS
ANTIBODY SCREEN: NEGATIVE

## 2016-12-14 MED ORDER — FERROUS SULFATE 325 (65 FE) MG PO TABS: 325.0000 mg | ORAL_TABLET | Freq: Two times a day (BID) | ORAL | 1 refills | Status: DC

## 2016-12-14 NOTE — Discharge Summary (Signed)
Physician Discharge Summary  Patient ID: Wanda Duke MRN: [id number] DOB/AGE: 1961-04-10 56 y.o.  Admit date: 12/12/2016 Discharge date: 12/14/2016  Admission Diagnoses: S/p lap appy with abdominal wall bruising and hemoperitoneum  Discharge Diagnoses:  Active Problems:   S/P laparoscopic appendectomy   Discharged Condition: good  Hospital Course: 60 yof s/p lap appy for acute suppurative appendicitis about 8 days ago who presented with diarrhea and decreased hct.  She was found on ct scan to have some hemoperitoneum and has significant abdominal wall bruising. I admitted her and followed her hct which remained stable.  I also evaluated her for von Willebrands disease which she said she was told she might have before and this appears negative. She is tolerating a diet and doing well and will be discharged home today  Consults: None  Significant Diagnostic Studies: none  Treatments: IV hydration   Disposition: 01-Home or Self Care   Allergies as of 12/14/2016      Reactions   Erythromycin Nausea Only      Medication List    STOP taking these medications   benzonatate 100 MG capsule Commonly known as:  TESSALON PERLES   naproxen sodium 220 MG tablet Commonly known as:  ANAPROX   oseltamivir 75 MG capsule Commonly known as:  TAMIFLU     TAKE these medications   ferrous sulfate 325 (65 FE) MG tablet Take 1 tablet (325 mg total) by mouth 2 (two) times daily with a meal.   oxyCODONE 5 MG immediate release tablet Commonly known as:  Oxy IR/ROXICODONE Take 1 tablet (5 mg total) by mouth every 4 (four) hours as needed for moderate pain.            Discharge Care Instructions        Start     Ordered   12/14/16 0000  ferrous sulfate 325 (65 FE) MG tablet  2 times daily with meals     12/14/16 9211     Follow-up Information    Wanda Bookbinder, MD Follow up.   Specialty:  General Surgery Contact information: [place]  [address] [phone]           Signed: Rolm Duke 12/14/2016, 3:34 PM

## 2016-12-14 NOTE — Progress Notes (Signed)
Subjective/Chief Complaint: Doing well, a loose stool, eating, pain much better, hct up   Objective: Vital signs in last 24 hours: Temp:  [99.1 F (37.3 C)-99.8 F (37.7 C)] 99.1 F (37.3 C) (08/24 0650) Pulse Rate:  [71-83] 71 (08/24 0650) Resp:  [16-18] 18 (08/24 0650) BP: (115-135)/(62-74) 115/69 (08/24 0650) SpO2:  [98 %-100 %] 99 % (08/24 0650) Last BM Date: 12/12/16  Intake/Output from previous day: 08/23 0701 - 08/24 0700 In: 1340 [P.O.:1340] Out: 3200 [Urine:3200] Intake/Output this shift: No intake/output data recorded.  GI: ecchymosis resolving, mildly tender  Lab Results:   Recent Labs  12/13/16 0553 12/14/16 0602  WBC 7.1 6.8  HGB 7.8* 8.7*  HCT 24.0* 26.2*  PLT 328 358   BMET  Recent Labs  12/12/16 1245 12/12/16 1314 12/14/16 0602  NA 135 135 134*  K 4.4 4.5 3.7  CL 102 101 99*  CO2 24  --  23  GLUCOSE 100* 98 86  BUN 9 10 8   CREATININE 0.69 0.70 0.67  CALCIUM 9.4  --  9.4   PT/INR  Recent Labs  12/12/16 1841  LABPROT 13.6  INR 1.04   ABG No results for input(s): PHART, HCO3 in the last 72 hours.  Invalid input(s): PCO2, PO2  Studies/Results: Ct Abdomen Pelvis W Contrast  Addendum Date: 12/12/2016   ADDENDUM REPORT: 12/12/2016 15:20 ADDENDUM: Study discussed by telephone with Dr. Veryl Speak on 12/12/2016 at 1508 hours. He advises a significant drop in hemoglobin since the time of surgery, consistent with postoperative bleeding and hemoperitoneum. Electronically Signed   By: Genevie Ann M.D.   On: 12/12/2016 15:20   Result Date: 12/12/2016 CLINICAL DATA:  56 year old female with increasing right lower quadrant abdominal pain status post appendectomy last week. Fever for 2 days. EXAM: CT ABDOMEN AND PELVIS WITH CONTRAST TECHNIQUE: Multidetector CT imaging of the abdomen and pelvis was performed using the standard protocol following bolus administration of intravenous contrast. CONTRAST:  153mL ISOVUE-300 IOPAMIDOL (ISOVUE-300)  INJECTION 61% COMPARISON:  Preoperative CT Abdomen and Pelvis 12/04/2016. FINDINGS: Lower chest: Mildly lower lung volumes. No pleural effusion. Minor lung base atelectasis. No pericardial effusion. Hepatobiliary: Small volume of perihepatic free fluid. Otherwise stable and negative (occasional small hepatic low-density areas again noted, at least some of which are benign hemangiomas). Gallbladder and biliary tree within normal limits. Pancreas: Negative. Spleen: Negative. Adrenals/Urinary Tract: Normal adrenal glands. Bilateral renal enhancement and contrast excretion is within normal limits. Contrast opacifies both ureters normally to the urinary bladder. The bladder is unremarkable except for adjacent free fluid. Stomach/Bowel: It appears no oral contrast was administered. No pneumoperitoneum identified, but there is a moderate volume of intermediate density fluid tracking along the right gutter from the right liver to the lower quadrant and pelvis. Smaller similar density fluid in the distal small bowel mesentery. No organized / rim enhancing fluid collection identified. Staple line along the inferior tip of the cecum lateral to the terminal ileum. The lateral wall of the right colon is obscured by the fluid. No definite cecal wall thickening. No dilated small bowel. The TI and ileal loops are mildly irregular. Decompressed stomach. Duodenum and jejunum are within normal limits. Decompressed but somewhat irregular-appearing rectosigmoid colon which is bordered by fluid. Decompressed left colon and transverse colon have a more normal appearance. Vascular/Lymphatic: Major arterial structures in the abdomen and pelvis are patent. Portal venous system appears patent. Reproductive: Surgically absent. Chronic right adnexal region surgical clips appears stable. Other: Moderate volume of intermediate density fluid  throughout the pelvis (series 4, image 70). Postoperative changes to the ventral abdominal wall with mild  generalized soft tissue stranding. No ventral abdominal hernia or abdominal wall fluid collection identified. Musculoskeletal: Stable.  No acute osseous abnormality identified. IMPRESSION: 1. Moderate volume of intermediate density fluid tracking along the right gutter and into the right lower quadrant and pelvis raising the possibility of Hemoperitoneum. Correlate with hemoglobin/hematocrit. 2. No free air to suggest bowel perforation, and no organized or rim enhancing fluid collection to suggest abscess. 3. No bowel obstruction. Mild irregularity of distal small bowel loops, the right colon, and distal colon. 4. Expected postoperative changes to the ventral abdominal wall. Electronically Signed: By: Genevie Ann M.D. On: 12/12/2016 15:04    Anti-infectives: Anti-infectives    None      Assessment/Plan: S/p lap appy   vwd panel normal Dc home today She is not bleeding  [facility] 12/14/2016

## 2016-12-14 NOTE — Discharge Instructions (Signed)
[facility] LAPAROSCOPIC SURGERY: POST OP INSTRUCTIONS  Always review your discharge instruction sheet given to you by the facility where your surgery was performed. IF YOU HAVE DISABILITY OR FAMILY LEAVE FORMS, YOU MUST BRING THEM TO THE OFFICE FOR PROCESSING.   DO NOT GIVE THEM TO YOUR DOCTOR.  1. A prescription for pain medication may be given to you upon discharge.  Take your pain medication as prescribed, if needed.  If narcotic pain medicine is not needed, then you may take acetaminophen (Tylenol), naprosyn (Alleve), or ibuprofen (Advil) as needed. 2. Take your usually prescribed medications unless otherwise directed. 3. If you need a refill on your pain medication, please contact your pharmacy.  They will contact our office to request authorization. Prescriptions will not be filled after 5pm or on week-ends. 4. You should follow a light diet the first few days after arrival home, such as soup and crackers, etc.  Be sure to include lots of fluids daily. 5. Most patients will experience some swelling and bruising in the area of the incisions.  Ice packs will help.  Swelling and bruising can take several days to resolve.  6. It is common to experience some constipation if taking pain medication after surgery.  Increasing fluid intake and taking a stool softener (such as Colace) will usually help or prevent this problem from occurring.  A mild laxative (Milk of Magnesia or Miralax) should be taken according to package instructions if there are no bowel movements after 48 hours. 7. Unless discharge instructions indicate otherwise, you may remove your bandages 48 hours after surgery, and you may shower at that time.  You may have steri-strips (small skin tapes) in place directly over the incision.  These strips should be left on the skin for 7-10 days.  If your surgeon used skin glue on the incision, you may shower in 24 hours.  The glue will flake  off over the next 2-3 weeks.  Any sutures or staples will be removed at the office during your follow-up visit. 8. ACTIVITIES:  You may resume regular (light) daily activities beginning the next day--such as daily self-care, walking, climbing stairs--gradually increasing activities as tolerated.  You may have sexual intercourse when it is comfortable.  Refrain from any heavy lifting or straining until approved by your doctor. a. You may drive when you are no longer taking prescription pain medication, you can comfortably wear a seatbelt, and you can safely maneuver your car and apply brakes. b. RETURN TO WORK:  __________________________________________________________ 9. You should see your doctor in the office for a follow-up appointment approximately 2-3 weeks after your surgery.  Make sure that you call for this appointment within a day or two after you arrive home to insure a convenient appointment time. 10. OTHER INSTRUCTIONS: __________________________________________________________________________________________________________________________ __________________________________________________________________________________________________________________________ WHEN TO CALL YOUR DOCTOR: 1. Fever over 101.0 2. Inability to urinate 3. Continued bleeding from incision. 4. Increased pain, redness, or drainage from the incision. 5. Increasing abdominal pain  The clinic staff is available to answer your questions during regular business hours.  Please don't hesitate to call and ask to speak to one of the nurses for clinical concerns.  If you have a medical emergency, go to the nearest emergency room or call 911.  A surgeon from [facility] is always on call at the hospital. [address], [address], [place], [place]  [address] ? [address] [address], [place], [place]   [address] [phone] ? [phone] ? [phone] Web site: [email or web address]  

## 2016-12-14 NOTE — Progress Notes (Signed)
Patient discharged to home. AVS was reviewed and given to patient, patient agreed and verbalized understanding with no further questions at this moment. Patient was transported via wheelchair by a tech to front lobby for pick.   Vergia Alberts n 12/14/16 11:02 AM

## 2016-12-17 DIAGNOSIS — D649 Anemia, unspecified: Secondary | ICD-10-CM | POA: Diagnosis not present

## 2017-01-24 DIAGNOSIS — Z0000 Encounter for general adult medical examination without abnormal findings: Secondary | ICD-10-CM | POA: Diagnosis not present

## 2017-01-31 DIAGNOSIS — Z0000 Encounter for general adult medical examination without abnormal findings: Secondary | ICD-10-CM | POA: Diagnosis not present

## 2017-01-31 DIAGNOSIS — Z23 Encounter for immunization: Secondary | ICD-10-CM | POA: Diagnosis not present

## 2017-01-31 DIAGNOSIS — Z1389 Encounter for screening for other disorder: Secondary | ICD-10-CM | POA: Diagnosis not present

## 2017-01-31 DIAGNOSIS — E7849 Other hyperlipidemia: Secondary | ICD-10-CM | POA: Diagnosis not present

## 2017-01-31 DIAGNOSIS — I8390 Asymptomatic varicose veins of unspecified lower extremity: Secondary | ICD-10-CM | POA: Diagnosis not present

## 2017-01-31 DIAGNOSIS — Z780 Asymptomatic menopausal state: Secondary | ICD-10-CM | POA: Diagnosis not present

## 2017-01-31 DIAGNOSIS — Z8249 Family history of ischemic heart disease and other diseases of the circulatory system: Secondary | ICD-10-CM | POA: Diagnosis not present

## 2017-03-06 DIAGNOSIS — Z1231 Encounter for screening mammogram for malignant neoplasm of breast: Secondary | ICD-10-CM | POA: Diagnosis not present

## 2017-05-30 DIAGNOSIS — N393 Stress incontinence (female) (male): Secondary | ICD-10-CM | POA: Diagnosis not present

## 2017-05-30 DIAGNOSIS — N951 Menopausal and female climacteric states: Secondary | ICD-10-CM | POA: Diagnosis not present

## 2017-05-30 DIAGNOSIS — Z01419 Encounter for gynecological examination (general) (routine) without abnormal findings: Secondary | ICD-10-CM | POA: Diagnosis not present

## 2017-05-30 DIAGNOSIS — Z79890 Hormone replacement therapy (postmenopausal): Secondary | ICD-10-CM | POA: Diagnosis not present

## 2017-05-30 DIAGNOSIS — N9419 Other specified dyspareunia: Secondary | ICD-10-CM | POA: Diagnosis not present

## 2017-11-07 DIAGNOSIS — N951 Menopausal and female climacteric states: Secondary | ICD-10-CM | POA: Diagnosis not present

## 2017-11-07 DIAGNOSIS — Z79890 Hormone replacement therapy (postmenopausal): Secondary | ICD-10-CM | POA: Diagnosis not present

## 2017-11-07 DIAGNOSIS — N9419 Other specified dyspareunia: Secondary | ICD-10-CM | POA: Diagnosis not present

## 2018-01-27 DIAGNOSIS — Z0000 Encounter for general adult medical examination without abnormal findings: Secondary | ICD-10-CM | POA: Diagnosis not present

## 2018-01-27 DIAGNOSIS — D649 Anemia, unspecified: Secondary | ICD-10-CM | POA: Diagnosis not present

## 2018-02-03 DIAGNOSIS — Z8249 Family history of ischemic heart disease and other diseases of the circulatory system: Secondary | ICD-10-CM | POA: Diagnosis not present

## 2018-02-03 DIAGNOSIS — Z23 Encounter for immunization: Secondary | ICD-10-CM | POA: Diagnosis not present

## 2018-02-03 DIAGNOSIS — Z0000 Encounter for general adult medical examination without abnormal findings: Secondary | ICD-10-CM | POA: Diagnosis not present

## 2018-02-03 DIAGNOSIS — Z1389 Encounter for screening for other disorder: Secondary | ICD-10-CM | POA: Diagnosis not present

## 2018-02-03 DIAGNOSIS — R918 Other nonspecific abnormal finding of lung field: Secondary | ICD-10-CM | POA: Diagnosis not present

## 2018-02-03 DIAGNOSIS — E7849 Other hyperlipidemia: Secondary | ICD-10-CM | POA: Diagnosis not present

## 2018-02-03 DIAGNOSIS — D649 Anemia, unspecified: Secondary | ICD-10-CM | POA: Diagnosis not present

## 2018-04-30 ENCOUNTER — Other Ambulatory Visit: Payer: Self-pay | Admitting: Internal Medicine

## 2018-04-30 ENCOUNTER — Ambulatory Visit
Admission: RE | Admit: 2018-04-30 | Discharge: 2018-04-30 | Disposition: A | Discharge status: Home / Self Care | Payer: 59 | Source: Ambulatory Visit

## 2018-04-30 DIAGNOSIS — Z1231 Encounter for screening mammogram for malignant neoplasm of breast: Secondary | ICD-10-CM

## 2018-10-13 IMAGING — CT CT ABD-PELV W/ CM
2 of 5 series · 16 of 46 positions shown, 18 images · IV contrast (APPLIED)
Comparison: Report 12/30/2001

CLINICAL DATA: Abdominal pain for 2-3 days with nausea and vomiting

EXAM:
CT ABDOMEN AND PELVIS WITH CONTRAST
TECHNIQUE: Multidetector CT imaging of the abdomen and pelvis was performed
using the standard protocol following bolus administration of
intravenous contrast.
CONTRAST:  100mL 0YEKDT-JRR IOPAMIDOL (0YEKDT-JRR) INJECTION 61%

[Series 3: abd/pelvis 5.0 · axial · 0.80mm/px · z∈[-1055,-695]mm · 13 of 86 slices shown, 15 images]
[im 7/86  soft-tissue]
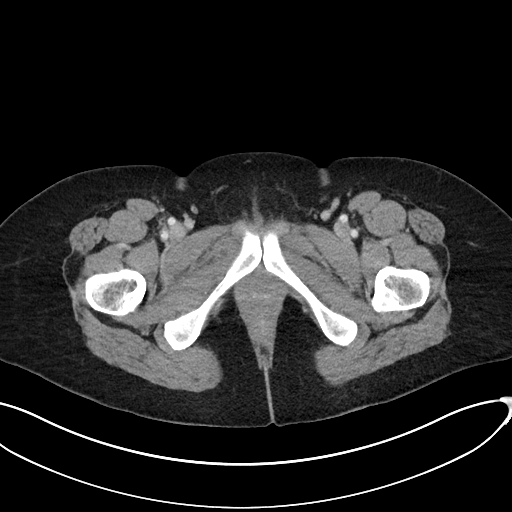
[im 7/86  bone]
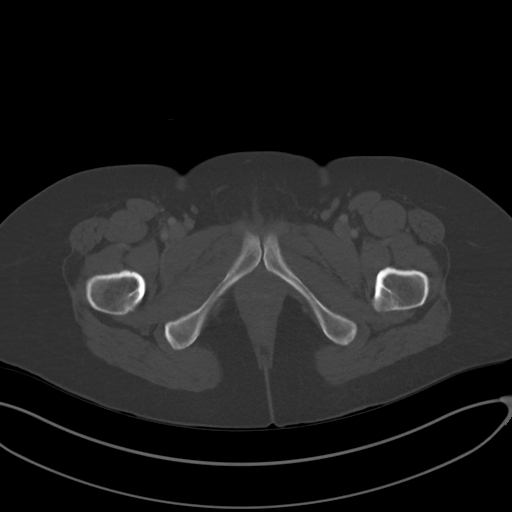
[im 13/86  soft-tissue]
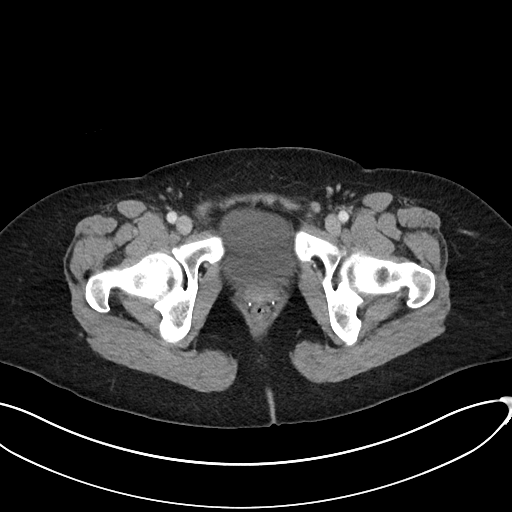
[im 19/86  soft-tissue]
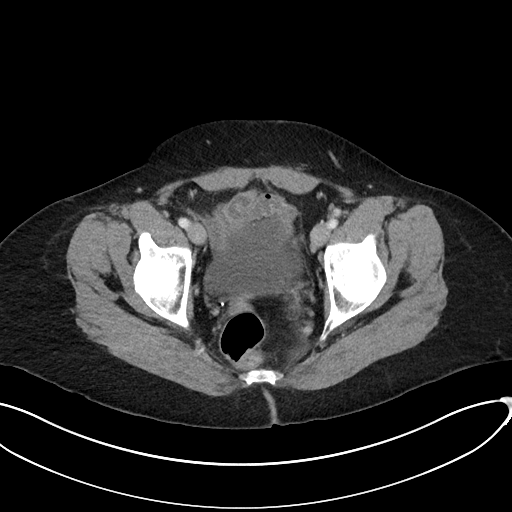
[im 25/86  soft-tissue]
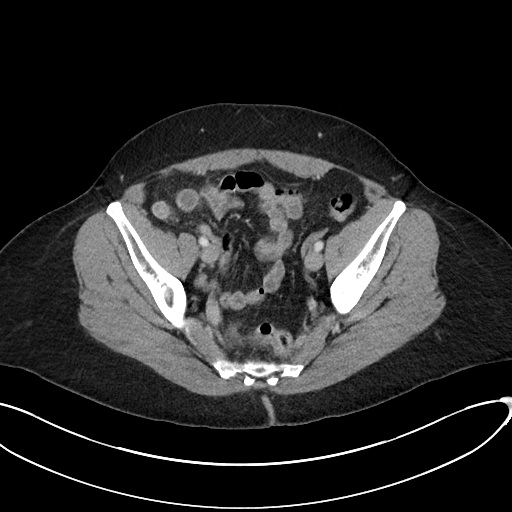
[im 31/86  soft-tissue]
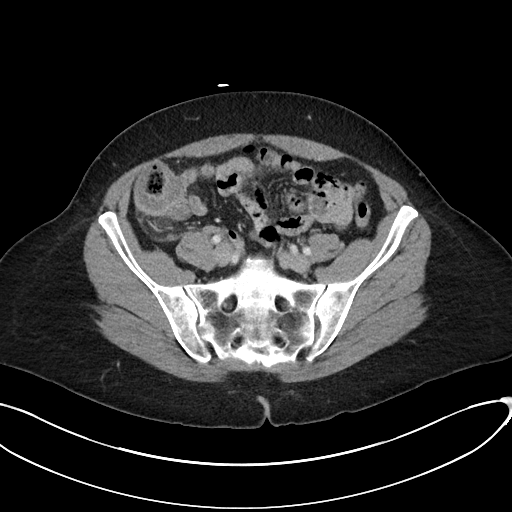
[im 37/86  soft-tissue]
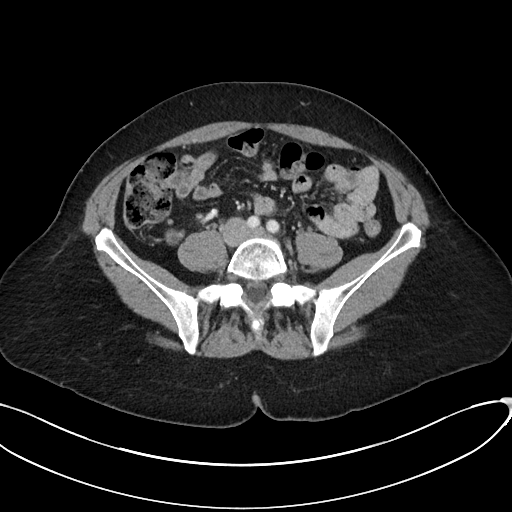
[im 43/86  soft-tissue]
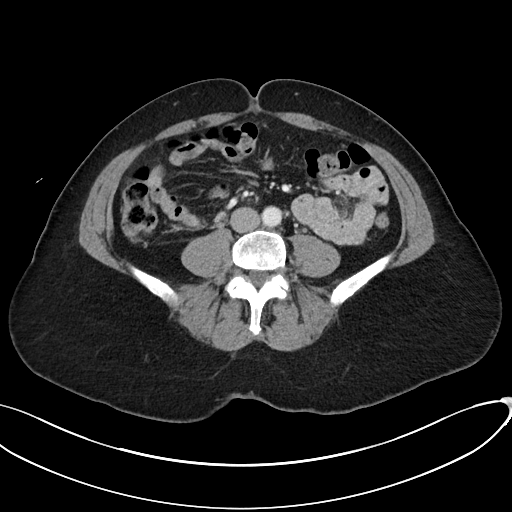
[im 49/86  soft-tissue]
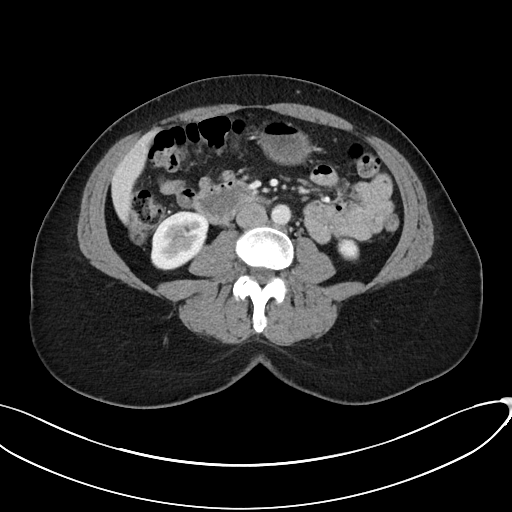
[im 55/86  soft-tissue]
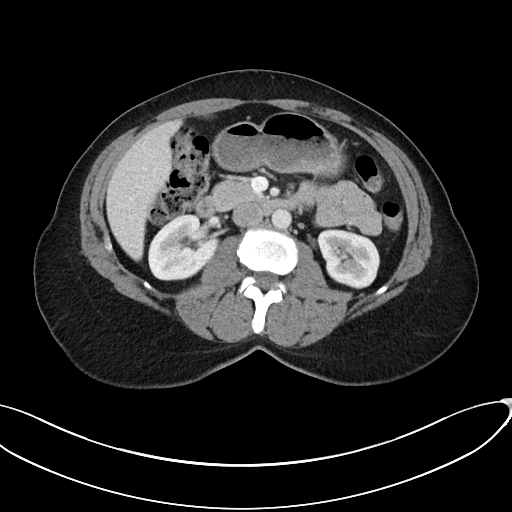
[im 55/86  bone]
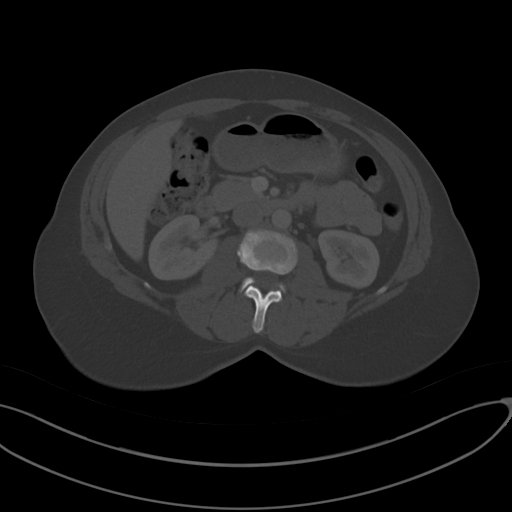
[im 61/86  soft-tissue]
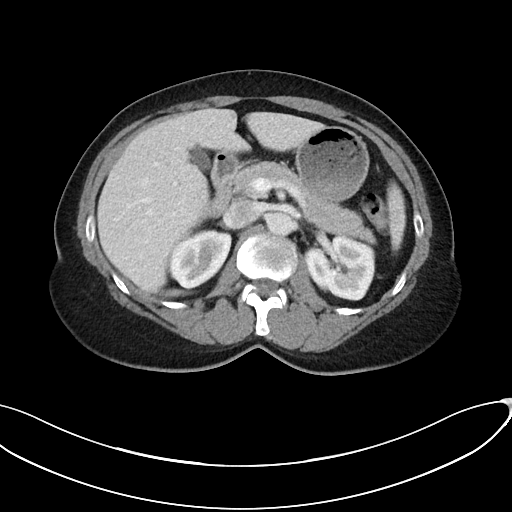
[im 67/86  soft-tissue]
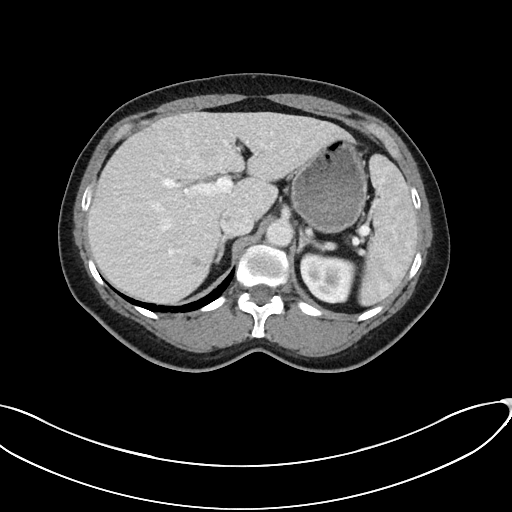
[im 73/86  soft-tissue]
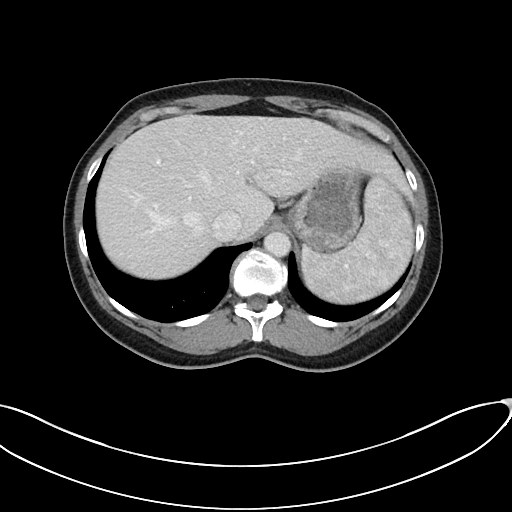
[im 79/86  soft-tissue]
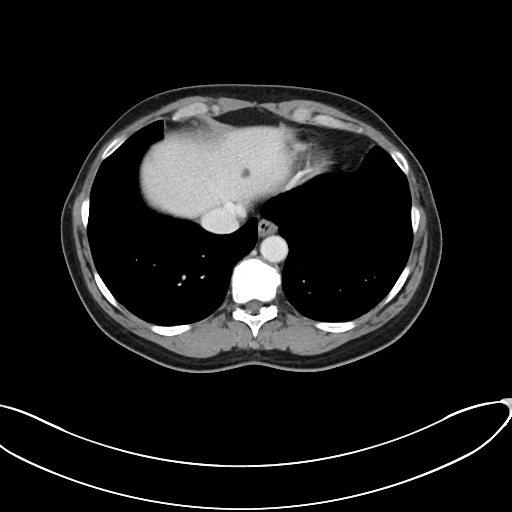

[Series 6: abd/pelvis 3.0 mpr cor · coronal · 0.77mm/px · 3 of 86 slices shown]
[im 29/86  soft-tissue]
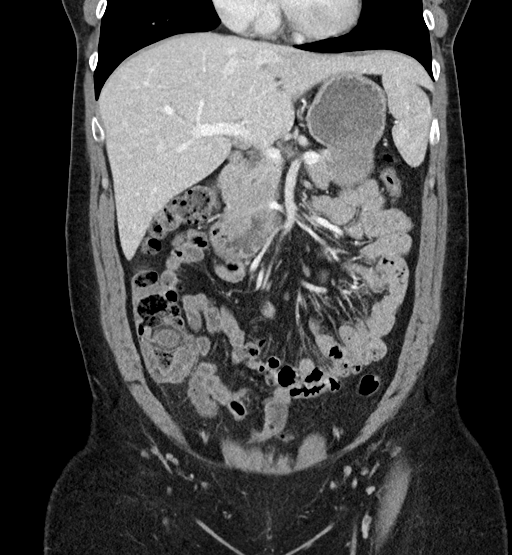
[im 38/86  soft-tissue]
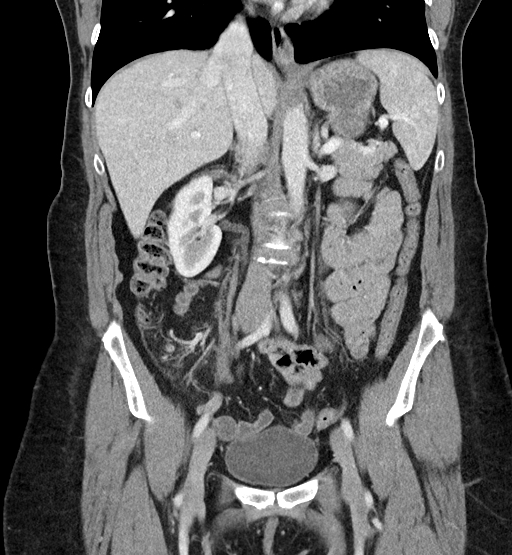
[im 48/86  soft-tissue]
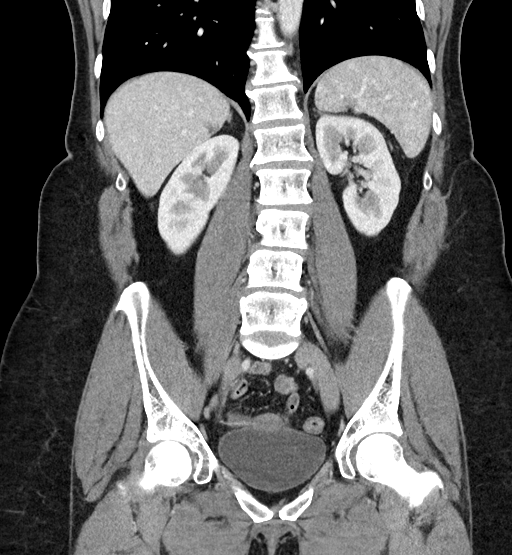

[16 of 46 positions shown; findings below may reference images not displayed]

FINDINGS: Lower chest: Lung bases demonstrate no acute consolidation or
pleural effusion. Normal heart size.

Hepatobiliary: Multiple subcentimeter hypodensities in the liver,
too small to further characterize, probably cysts. No calcified
gallstones or biliary dilatation

Pancreas: Unremarkable. No pancreatic ductal dilatation or
surrounding inflammatory changes.

Spleen: Normal in size without focal abnormality.

Adrenals/Urinary Tract: Adrenal glands are unremarkable. Kidneys are
normal, without renal calculi, focal lesion, or hydronephrosis.
Bladder is unremarkable.

Stomach/Bowel: Stomach within normal limits. No dilated small bowel.
Irregular thickening of the cecum. Enlarged appendix, measuring up
to 13 mm in size. Mild edema and haziness of the surrounding fat in
the right lower quadrant.

Vascular/Lymphatic: No significant vascular findings are present. No
enlarged abdominal or pelvic lymph nodes.

Reproductive: Status post hysterectomy. No adnexal masses.

Other: Negative for free air or free fluid

Musculoskeletal: Degenerative changes. No acute or suspicious bone
lesion.
IMPRESSION: 1. Enlarged appendix up to 13 mm with mild edema/haziness in
surrounding fat, concerning for an acute appendicitis. Negative for
perforation or abscess.
2. There is wall thickening of the cecum, it is presumed that this
is due to inflammation of the appendix
3. Subcentimeter liver hypodensities, too small to further
characterize

## 2019-06-30 ENCOUNTER — Other Ambulatory Visit: Payer: Self-pay | Admitting: Obstetrics and Gynecology

## 2019-06-30 DIAGNOSIS — Z1231 Encounter for screening mammogram for malignant neoplasm of breast: Secondary | ICD-10-CM

## 2019-07-08 ENCOUNTER — Ambulatory Visit
Admission: RE | Admit: 2019-07-08 | Discharge: 2019-07-08 | Disposition: A | Discharge status: Home / Self Care | Payer: 59 | Source: Ambulatory Visit

## 2019-07-08 ENCOUNTER — Other Ambulatory Visit: Payer: Self-pay

## 2019-07-08 DIAGNOSIS — Z1231 Encounter for screening mammogram for malignant neoplasm of breast: Secondary | ICD-10-CM

## 2019-12-15 ENCOUNTER — Other Ambulatory Visit: Payer: Self-pay

## 2019-12-15 ENCOUNTER — Other Ambulatory Visit: Payer: 59

## 2019-12-15 ENCOUNTER — Other Ambulatory Visit: Payer: Self-pay | Admitting: *Deleted

## 2019-12-15 DIAGNOSIS — Z20822 Contact with and (suspected) exposure to covid-19: Secondary | ICD-10-CM

## 2019-12-16 LAB — SARS-COV-2, NAA 2 DAY TAT

## 2019-12-16 LAB — NOVEL CORONAVIRUS, NAA: SARS-CoV-2, NAA: NOT DETECTED

## 2020-01-05 ENCOUNTER — Other Ambulatory Visit: Payer: Self-pay | Admitting: Critical Care Medicine

## 2020-01-05 ENCOUNTER — Other Ambulatory Visit: Payer: Self-pay

## 2020-01-05 DIAGNOSIS — Z20822 Contact with and (suspected) exposure to covid-19: Secondary | ICD-10-CM

## 2020-01-07 LAB — SARS-COV-2, NAA 2 DAY TAT

## 2020-01-07 LAB — NOVEL CORONAVIRUS, NAA: SARS-CoV-2, NAA: NOT DETECTED

## 2020-01-19 ENCOUNTER — Other Ambulatory Visit: Payer: Self-pay

## 2020-01-20 ENCOUNTER — Other Ambulatory Visit: Payer: 59

## 2020-01-20 DIAGNOSIS — Z20822 Contact with and (suspected) exposure to covid-19: Secondary | ICD-10-CM

## 2020-01-21 LAB — SARS-COV-2, NAA 2 DAY TAT

## 2020-01-21 LAB — NOVEL CORONAVIRUS, NAA: SARS-CoV-2, NAA: NOT DETECTED

## 2020-02-23 ENCOUNTER — Other Ambulatory Visit: Payer: 59

## 2020-02-23 DIAGNOSIS — Z20822 Contact with and (suspected) exposure to covid-19: Secondary | ICD-10-CM

## 2020-02-24 LAB — SARS-COV-2, NAA 2 DAY TAT

## 2020-02-24 LAB — NOVEL CORONAVIRUS, NAA: SARS-CoV-2, NAA: NOT DETECTED

## 2020-06-08 ENCOUNTER — Other Ambulatory Visit: Payer: Self-pay | Admitting: Obstetrics and Gynecology

## 2020-06-08 DIAGNOSIS — Z1231 Encounter for screening mammogram for malignant neoplasm of breast: Secondary | ICD-10-CM

## 2020-07-13 ENCOUNTER — Other Ambulatory Visit: Payer: Self-pay

## 2020-07-13 ENCOUNTER — Ambulatory Visit
Admission: RE | Admit: 2020-07-13 | Discharge: 2020-07-13 | Disposition: A | Discharge status: Home / Self Care | Payer: 59 | Source: Ambulatory Visit

## 2020-07-13 DIAGNOSIS — Z1231 Encounter for screening mammogram for malignant neoplasm of breast: Secondary | ICD-10-CM

## 2021-07-11 ENCOUNTER — Other Ambulatory Visit: Payer: Self-pay | Admitting: Obstetrics and Gynecology

## 2021-07-11 DIAGNOSIS — Z1231 Encounter for screening mammogram for malignant neoplasm of breast: Secondary | ICD-10-CM

## 2021-07-14 ENCOUNTER — Ambulatory Visit
Admission: RE | Admit: 2021-07-14 | Discharge: 2021-07-14 | Disposition: A | Discharge status: Home / Self Care | Payer: Commercial Managed Care - PPO | Source: Ambulatory Visit

## 2021-07-14 DIAGNOSIS — Z1231 Encounter for screening mammogram for malignant neoplasm of breast: Secondary | ICD-10-CM

## 2021-07-18 ENCOUNTER — Ambulatory Visit
Admission: RE | Admit: 2021-07-18 | Discharge: 2021-07-18 | Disposition: A | Discharge status: Home / Self Care | Payer: Commercial Managed Care - PPO | Source: Ambulatory Visit | Attending: Obstetrics and Gynecology | Admitting: Obstetrics and Gynecology

## 2022-01-22 ENCOUNTER — Encounter: Payer: Self-pay | Admitting: Physician Assistant

## 2022-02-22 ENCOUNTER — Ambulatory Visit: Payer: Commercial Managed Care - PPO | Admitting: Physician Assistant

## 2022-06-06 ENCOUNTER — Other Ambulatory Visit: Payer: Self-pay | Admitting: Internal Medicine

## 2022-06-06 DIAGNOSIS — Z1231 Encounter for screening mammogram for malignant neoplasm of breast: Secondary | ICD-10-CM

## 2022-07-23 ENCOUNTER — Ambulatory Visit
Admission: RE | Admit: 2022-07-23 | Discharge: 2022-07-23 | Disposition: A | Discharge status: Home / Self Care | Payer: Commercial Managed Care - PPO | Source: Ambulatory Visit

## 2022-07-23 DIAGNOSIS — Z1231 Encounter for screening mammogram for malignant neoplasm of breast: Secondary | ICD-10-CM

## 2022-10-11 ENCOUNTER — Ambulatory Visit: Payer: Commercial Managed Care - PPO | Admitting: Podiatry

## 2022-10-11 ENCOUNTER — Ambulatory Visit (INDEPENDENT_AMBULATORY_CARE_PROVIDER_SITE_OTHER): Payer: Commercial Managed Care - PPO

## 2022-10-11 ENCOUNTER — Encounter: Payer: Self-pay | Admitting: Podiatry

## 2022-10-11 DIAGNOSIS — M7751 Other enthesopathy of right foot: Secondary | ICD-10-CM

## 2022-10-11 DIAGNOSIS — M778 Other enthesopathies, not elsewhere classified: Secondary | ICD-10-CM

## 2022-10-11 DIAGNOSIS — M21619 Bunion of unspecified foot: Secondary | ICD-10-CM | POA: Diagnosis not present

## 2022-10-11 MED ORDER — TRIAMCINOLONE ACETONIDE 10 MG/ML IJ SUSP
10.0000 mg | Freq: Once | INTRAMUSCULAR | Status: AC
  Administered 2022-10-11: 10 mg

## 2022-10-12 NOTE — Progress Notes (Signed)
Patient presents stating she is go Subjective:   Patient ID: Wanda Duke, female   DOB: 62 y.o.   MRN: [id number]   HPI Patient presents stating she has developed a a lot of pain underneath her right foot and it has been going on now for several months.  Patient states she is very active and it is worse after playing sports tennis running or other activities.  Also feels knots in both of her arches left over right and wanted that checked.  Patient does not smoke likes to be active   Review of Systems  All other systems reviewed and are negative.       Objective:  Physical Exam Vitals and nursing note reviewed.  Constitutional:      Appearance: She is well-developed.  Pulmonary:     Effort: Pulmonary effort is normal.  Musculoskeletal:        General: Normal range of motion.  Skin:    General: Skin is warm.  Neurological:     Mental Status: She is alert.     Neurovascular status found to be intact muscle strength was found to be adequate range of motion adequate.  Patient does have inflammation mostly centered around the second MPJ right with fluid buildup around the area and has some very superficial lesions in the area does have small nodular formation left over right plantar fascia.  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  I debrided some small amount of tissue right that may help also and patient will be seen back if symptoms indicate and do not recommend treatment for any type of plantar fibroma unless they were to grow or  become painful  X-rays did not indicate signs of arthritis around the joint surface mild bunion deformity no other pathology noted     .[email or web address]

## 2023-02-19 ENCOUNTER — Ambulatory Visit (HOSPITAL_BASED_OUTPATIENT_CLINIC_OR_DEPARTMENT_OTHER): Payer: Commercial Managed Care - PPO | Admitting: Obstetrics & Gynecology

## 2023-02-26 ENCOUNTER — Encounter (HOSPITAL_BASED_OUTPATIENT_CLINIC_OR_DEPARTMENT_OTHER): Payer: Self-pay | Admitting: Obstetrics & Gynecology

## 2023-02-26 ENCOUNTER — Ambulatory Visit (HOSPITAL_BASED_OUTPATIENT_CLINIC_OR_DEPARTMENT_OTHER): Payer: Commercial Managed Care - PPO | Admitting: Obstetrics & Gynecology

## 2023-02-26 VITALS — BP 167/91 | HR 64 | Ht 63.5 in | Wt 180.2 lb

## 2023-02-26 DIAGNOSIS — Z780 Asymptomatic menopausal state: Secondary | ICD-10-CM

## 2023-02-26 DIAGNOSIS — N952 Postmenopausal atrophic vaginitis: Secondary | ICD-10-CM | POA: Diagnosis not present

## 2023-02-26 DIAGNOSIS — Z8249 Family history of ischemic heart disease and other diseases of the circulatory system: Secondary | ICD-10-CM

## 2023-02-26 DIAGNOSIS — R635 Abnormal weight gain: Secondary | ICD-10-CM

## 2023-02-26 DIAGNOSIS — N393 Stress incontinence (female) (male): Secondary | ICD-10-CM

## 2023-02-26 DIAGNOSIS — Z658 Other specified problems related to psychosocial circumstances: Secondary | ICD-10-CM

## 2023-02-26 DIAGNOSIS — Z01419 Encounter for gynecological examination (general) (routine) without abnormal findings: Secondary | ICD-10-CM | POA: Diagnosis not present

## 2023-02-26 MED ORDER — FLUOXETINE HCL 10 MG PO TABS: 20.0000 mg | ORAL_TABLET | Freq: Every day | ORAL | 2 refills | Status: AC

## 2023-02-26 MED ORDER — ESTRADIOL 0.1 MG/GM VA CREA: TOPICAL_CREAM | VAGINAL | 2 refills | Status: AC

## 2023-02-26 NOTE — Progress Notes (Signed)
62 y.o. G2P2 Married White or Caucasian female here for annual exam/new patient.  H/o hysterectomy.  H/o CKC at age 20 due to cervical cancer.  Never had another abnormal pap smear.  Later had abdominal hysterectomy due to benign reasons.  Ovaries still remain.  Having a lot of vaginal dryness and painful intercourse.    Having issues with incontinence.  She feels this is due to her weight.  Very frustrated with this.  Is in the public eye and does receive very unkind emails.    She does have some significant hx of heart disease in her family.  This concerns her.  Followed by Dr. Timothy Lasso.  Coronary CT discussed.    Has lots of stressors with care for family members.  Doesn't sleep well but feels her career is to blame.  Getting up at 3:30 for many years may have contributed.    No LMP recorded. Patient has had a hysterectomy.          Sexually active: Yes.    The current method of family planning is status post hysterectomy.    Smoker:  no  Health Maintenance: Pap:  not indicated History of abnormal Pap:  yes in early 20's CKC, cervical cancer MMG:  07/23/2022 Negative Colonoscopy:  08/19/2015, follow up 10 years BMD:   guidelines reviewed Screening Labs: does with Dr. Timothy Lasso   reports that she has never smoked. She has never used smokeless tobacco. She reports current alcohol use. She reports that she does not use drugs.  Past Medical History:  Diagnosis Date   Bleeds easily [facility])    "always have" (12/12/2016)    Past Surgical History:  Procedure Laterality Date   ABDOMINAL HYSTERECTOMY  2007   "still have my ovaries"   APPENDECTOMY     CESAREAN SECTION  1997; 1998   KNEE SURGERY Right    LAPAROSCOPIC APPENDECTOMY N/A 12/04/2016   Procedure: APPENDECTOMY LAPAROSCOPIC;  Surgeon: Emelia Loron, MD;  Location: [facility] OR;  Service: General;  Laterality: N/A;   LAPAROSCOPY     for endometriosis   WISDOM TOOTH EXTRACTION      No current outpatient medications on file.   No  current facility-administered medications for this visit.    Family History  Problem Relation Age of Onset   Colon cancer Neg Hx    Colon polyps Neg Hx    Rectal cancer Neg Hx    Stomach cancer Neg Hx    Breast cancer Neg Hx     ROS: Constitutional: negative Genitourinary: vaginal dryness/dyspareunia   Exam:   BP (!) 167/91 (BP Location: Right Arm, Patient Position: Sitting, Cuff Size: Large)   Pulse 64   Ht 5' 3.5" (1.613 m)   Wt 180 lb 3.2 oz (81.7 kg)   BMI 31.42 kg/m   Height: 5' 3.5" (161.3 cm)  General appearance: alert, cooperative and appears stated age Head: Normocephalic, without obvious abnormality, atraumatic Neck: no adenopathy, supple, symmetrical, trachea midline and thyroid normal to inspection and palpation Lungs: clear to auscultation bilaterally Breasts: normal appearance, no masses or tenderness Heart: regular rate and rhythm Abdomen: soft, non-tender; bowel sounds normal; no masses,  no organomegaly Extremities: extremities normal, atraumatic, no cyanosis or edema Skin: Skin color, texture, turgor normal. No rashes or lesions Lymph nodes: Cervical, supraclavicular, and axillary nodes normal. No abnormal inguinal nodes palpated Neurologic: Grossly normal   Pelvic: External genitalia:  no lesions              Urethra:  normal  appearing urethra with no masses, tenderness or lesions              Bartholins and Skenes: normal                 Vagina: atrophic vaginal mucosa, no lesions              Cervix: absent              Pap taken: No. Bimanual Exam:  Uterus:  uterus absent              Adnexa: no mass, fullness, tenderness               Rectovaginal: Confirms               Anus:  normal sphincter tone, no lesions  Chaperone, Ina Homes, CMA, was present for exam.  Assessment/Plan: 1. Well woman exam with routine gynecological exam - Pap smear not indicated - Mammogram 07/23/2022 - Colonoscopy 08/19/2015, follow up 10 years - lab work done  with PCP, Dr. Timothy Lasso - vaccines reviewed/updated  2. Stress incontinence - Ambulatory referral to Physical Therapy  3. Weight gain  4. Family history of heart disease - have recommended she reach out to Dr. Timothy Lasso to order coronary CT  5. Postmenopausal  6. Vaginal atrophy - estradiol (ESTRACE) 0.1 MG/GM vaginal cream; 1 gram vaginally twice weekly  Dispense: 42.5 g; Refill: 2  7. Psychosocial stressors - FLUoxetine (PROZAC) 10 MG tablet; Take 2 tablets (20 mg total) by mouth daily.  Dispense: 30 tablet; Refill: 2  - follow up 6 weeks

## 2023-03-01 ENCOUNTER — Encounter (HOSPITAL_BASED_OUTPATIENT_CLINIC_OR_DEPARTMENT_OTHER): Payer: Self-pay | Admitting: Obstetrics & Gynecology

## 2023-04-09 ENCOUNTER — Ambulatory Visit (HOSPITAL_BASED_OUTPATIENT_CLINIC_OR_DEPARTMENT_OTHER): Payer: Commercial Managed Care - PPO | Admitting: Obstetrics & Gynecology

## 2023-05-08 ENCOUNTER — Encounter (HOSPITAL_BASED_OUTPATIENT_CLINIC_OR_DEPARTMENT_OTHER): Payer: Self-pay | Admitting: Obstetrics & Gynecology

## 2023-05-15 ENCOUNTER — Telehealth (HOSPITAL_BASED_OUTPATIENT_CLINIC_OR_DEPARTMENT_OTHER): Payer: Self-pay | Admitting: *Deleted

## 2023-05-15 NOTE — Telephone Encounter (Signed)
Pt called to ask if she could have her estradiol prescription changed from vaginal cream to a patch. She also asks if she would need to take any progesterone as well. Pt reports that she had a Cardiac Calcium scan done at [facility] in November and the score was 0. Advised that I would send her request to provider and get back to her with recommendations.

## 2023-05-22 ENCOUNTER — Other Ambulatory Visit (HOSPITAL_BASED_OUTPATIENT_CLINIC_OR_DEPARTMENT_OTHER): Payer: Self-pay | Admitting: Obstetrics & Gynecology

## 2023-05-22 MED ORDER — ESTRADIOL 0.0375 MG/24HR TD PTTW: 1.0000 | MEDICATED_PATCH | TRANSDERMAL | 1 refills | Status: DC

## 2023-05-22 MED ORDER — ESTRADIOL 0.0375 MG/24HR TD PTWK: 0.0375 mg | MEDICATED_PATCH | TRANSDERMAL | 1 refills | Status: DC

## 2023-05-22 NOTE — Telephone Encounter (Signed)
Called pt to provide recommendations per Dr. Hyacinth Meeker. Advised pt that provider thinks that it is ok to try estrogen patch. She recommends starting with the 0.0375mg  patches. Advised that since  she has had a hysterectomy, she does not need progesterone. Advised that she had significant vaginal changes due to lack of estrogen. Advised that she should continue the vaginal estrogen until provider can see how much improvement she has with patch. Advised that starting estrogen at 62 does have some risks vs starting earlier.  Provider is aware that  her coronary calcium score was 0 but the risks would be MI and stroke.  Advised that knowing her coronary calcium score was good does mean these risks are lower but not zero. Pt verbalizes understanding and wishes to proceed with trying patch. Rx sent to pharmacy. Pt provided with 1 month follow up appt.

## 2023-05-22 NOTE — Addendum Note (Signed)
Addended by: Harrie Jeans on: 05/22/2023 04:49 PM   Modules accepted: Orders

## 2023-06-19 ENCOUNTER — Encounter: Payer: Self-pay | Admitting: Podiatry

## 2023-06-19 ENCOUNTER — Ambulatory Visit: Payer: Commercial Managed Care - PPO | Admitting: Podiatry

## 2023-06-19 DIAGNOSIS — M7751 Other enthesopathy of right foot: Secondary | ICD-10-CM

## 2023-06-19 DIAGNOSIS — D3610 Benign neoplasm of peripheral nerves and autonomic nervous system, unspecified: Secondary | ICD-10-CM

## 2023-06-19 MED ORDER — TRIAMCINOLONE ACETONIDE 10 MG/ML IJ SUSP
10.0000 mg | Freq: Once | INTRAMUSCULAR | Status: AC
Start: 2023-06-19 — End: 2023-06-19
  Administered 2023-06-19: 10 mg via INTRA_ARTICULAR

## 2023-06-19 MED ORDER — TRIAMCINOLONE ACETONIDE 10 MG/ML IJ SUSP: 10.0000 mg | Freq: Once | INTRAMUSCULAR | Status: AC

## 2023-06-20 NOTE — Progress Notes (Signed)
 Subjective:   Patient ID: Wanda Duke, female   DOB: 63 y.o.   MRN: [id number]   HPI Patient states she is still having problems with her right foot.  States it seems to be more with activity and it is a hard feeling to describe and is worse if she wears heels or shoes where she puts more pressure on her forefoot   ROS      Objective:  Physical Exam  Neurovascular status intact with the patient noted to have discomfort which now appears to be more centered around the third MPJ moderate bunion deformity and digital elevation digit 2.  Mild discomfort second interspace but I was not able to create a positive Mulder's type sign     Assessment:  Difficult problem with possibility for capsulitis of the subtle nature third MPJ possibility for nerve irritation second interspace or overall forefoot instability secondary to bunion deformity and increased pressure against the lesser MPJs     Plan:  H&P reviewed and at this point I am going to go more at the nerve and the third MPJ and I went ahead did sterile prep and I carefully injected the nerve at the second interspace with 3 mg dexamethasone Kenalog and periarticular around the third MPJ.  I want to see back again in 3 weeks we will reevaluate and see what else may be necessary depending on response.  Hopeful that we will get a good response to this treatment

## 2023-06-24 ENCOUNTER — Ambulatory Visit (HOSPITAL_BASED_OUTPATIENT_CLINIC_OR_DEPARTMENT_OTHER): Payer: Commercial Managed Care - PPO | Admitting: Obstetrics & Gynecology

## 2023-07-10 ENCOUNTER — Ambulatory Visit: Payer: Commercial Managed Care - PPO | Admitting: Podiatry

## 2023-07-17 ENCOUNTER — Encounter: Payer: Self-pay | Admitting: Podiatry

## 2023-07-17 ENCOUNTER — Ambulatory Visit: Admitting: Podiatry

## 2023-07-17 DIAGNOSIS — D3610 Benign neoplasm of peripheral nerves and autonomic nervous system, unspecified: Secondary | ICD-10-CM | POA: Diagnosis not present

## 2023-07-17 DIAGNOSIS — M7751 Other enthesopathy of right foot: Secondary | ICD-10-CM

## 2023-07-17 DIAGNOSIS — M21619 Bunion of unspecified foot: Secondary | ICD-10-CM

## 2023-07-18 NOTE — Progress Notes (Signed)
 Subjective:   Patient ID: Wanda Duke, female   DOB: 63 y.o.   MRN: [id number]   HPI Patient states she is feeling quite a bit better after the last treatment and that seemed to make a difference   ROS      Objective:  Physical Exam  Neurovascular status intact with pain around the third MPJ and second interspace which seems to have improved with the last injection with only mild discomfort now noted     Assessment:  Improvement of either neuroma symptomatology or capsulitis third MPJ     Plan:  Reviewed shoe gear recommendations of more rigid bottom especially with exercise and wide toe boxes and hopefully this will stay in good control and if not we will consider other treatments

## 2023-07-20 ENCOUNTER — Other Ambulatory Visit (HOSPITAL_BASED_OUTPATIENT_CLINIC_OR_DEPARTMENT_OTHER): Payer: Self-pay | Admitting: Obstetrics & Gynecology

## 2023-07-24 ENCOUNTER — Ambulatory Visit: Payer: Commercial Managed Care - PPO

## 2023-07-31 ENCOUNTER — Other Ambulatory Visit: Payer: Self-pay

## 2023-07-31 ENCOUNTER — Ambulatory Visit: Payer: Commercial Managed Care - PPO | Attending: Obstetrics & Gynecology

## 2023-07-31 DIAGNOSIS — M6281 Muscle weakness (generalized): Secondary | ICD-10-CM | POA: Diagnosis present

## 2023-07-31 DIAGNOSIS — R293 Abnormal posture: Secondary | ICD-10-CM | POA: Insufficient documentation

## 2023-07-31 DIAGNOSIS — N393 Stress incontinence (female) (male): Secondary | ICD-10-CM | POA: Diagnosis not present

## 2023-07-31 DIAGNOSIS — R279 Unspecified lack of coordination: Secondary | ICD-10-CM | POA: Insufficient documentation

## 2023-07-31 NOTE — Patient Instructions (Addendum)
 Moisturizers They are used in the vagina to hydrate the mucous membrane that make up the vaginal canal. Designed to keep a more normal acid balance (ph) Once placed in the vagina, it will last between two to three days.  Use 2-3 times per week at bedtime  Ingredients to avoid is glycerin and fragrance, can increase chance of infection Should not be used just before sex due to causing irritation Most are gels administered either in a tampon-shaped applicator or as a vaginal suppository. They are non-hormonal.   Types of Moisturizers(internal use)  Vitamin E vaginal suppositories- Whole foods, Amazon Moist Again Coconut oil- can break down condoms, any grocery store (prefer organic) Julva- (Do no use if taking  Tamoxifen) amazon Yes moisturizer- amazon NeuEve Silk , NeuEve Silver for menopausal or over 65 (if have severe vaginal atrophy or cancer treatments use NeuEve Silk for  1 month than move to [employer])- [employer], Rhodes.com Olive and Bee intimate cream- www.oliveandbee.com.au Mae vaginal moisturizer- Amazon Aloe Good Clean Love Hyaluronic acid Hyalofemme Reveree hyaluronic acid inserts   Creams to use externally on the Vulva area Marathon Oil (good for for cancer patients that had radiation to the area)- [place] or [employer].https://garcia-[email or web address]/ Vulva Balm/ V-magic cream by medicine mama- amazon Julva-amazon Vital "V Wild Yam salve ( help moisturize and help with thinning vulvar area, does have Beeswax MoodMaid Botanical Pro-Meno Wild Yam Cream- Amazon Desert Harvest Gele Cleo by Zane Herald labial moisturizer (Amazon),  Coconut or olive oil aloe Good Clean Love Enchanted Rose by intimate rose  Things to avoid in the vaginal area Do not use things to irritate the vulvar area No lotions just specialized creams for the vulva area- Neogyn, V-magic,  No soaps; can use Aveeno or Calendula cleanser, unscented Dove if needed. Must be gentle No deodorants No douches Good to  sleep without underwear to let the vaginal area to air out No scrubbing: spread the lips to let warm water rinse over labias and pat dry         Lubrication Used for intercourse to reduce friction Avoid ones that have glycerin, nonoxynol-9, petroleum, propylene glycol, chlorhexidine gluconate, warming gels, tingling gels, icing or cooling gel, scented Avoid parabens due to a preservative similar to female sex hormone May need to be reapplied once or several times during sexual activity Can be applied to both partners genitals prior to vaginal penetration to minimize friction or irritation Prevent irritation and mucosal tears that cause post coital pain and increased the risk of vaginal and urinary tract infections Oil-based lubricants cannot be used with condoms due to breaking them down.  Least likely to irritate vaginal tissue.  Plant based-lubes are safe Silicone-based lubrication are thicker and last long and used for post-menopausal women  Vaginal Lubricators Here is a list of some suggested lubricators you can use for intercourse. Use the most hypoallergenic product.  You can place on you or your partner.  Slippery Stuff ( water based) Sylk or Sliquid Natural H2O ( good  if frequent UTI's)- walmart, amazon Sliquid organics silk-(aloe and silicone based ) [employer] (www.blossom-organics.com)- (aloe based ) Coconut oil, olive oil -not good with condoms  PJur Woman Nude- (water based) amazon Uberlube- ( silicon) Amazon Aloe Vera- Sprouts has an organic one Yes lubricant- (water based and has plant oil based similar to silicone) Lo[employer] Platinum-Silicone, Target, [facility] Olive and Bee intimate cream-  www.oliveandbee.com.au Pink - International [employer] Erosense Sync- walmart, amazon Coconu- coconu.com Desert Hall[employer] Good Clean Love lubricants  Things to avoid in lubricants are glycerin, warming gels, tingling gels, icing or cooling  gels, and scented gels.  Also  avoid Vaseline. KY jelly,  and Astroglide contain chlorhexidine which kills good bacteria(lactobacilli)  Things to avoid in the vaginal area Do not use things to irritate the vulvar area No lotions- see below Soaps you  can use :Aveeno, Calendula, Good Clean Love cleanser if needed. Must be gentle No deodorants No douches Good to sleep without underwear to let the vaginal area to air out No scrubbing: spread the lips to let warm water rinse over labias and pat dry  Creams that can be used on the Vulva Area V [employer], walmart Vital V Wild Yam Salve Julva- [employer] Botanical Pro-Meno Wild Yam Cream Coconut oil, olive oil Cleo by [employer] labial moisturizer -Amazon,  Desert [place] Releveum ( lidocaine) or Desert [employer] Corporation Yes Moisturizer       The knack: Use this technique while coughing, laughing, sneezing, or with any activities that causes you to leak urine a little. Right before you perform one of these activities that increase pressure in the abdomen and pushes a little urine out, perform a pelvic floor muscle contraction and hold. If that does not completely stop the leaking, try tightening your thighs together in addition to performing a pelvic floor muscle contraction. Make sure you are not trying to stifle a cough, sneeze, or laugh; allow these activities in full as it will cause less pressure down into the bladder and pelvic floor muscles.      Urge Incontinence  Ideal urination frequency is every 2-4 wakeful hours, which equates to 5-8 times within a 24-hour period.   Urge incontinence is leakage that occurs when the bladder muscle contracts, creating a sudden need to go before getting to the bathroom.   Going too often when your bladder isn't actually full can disrupt the body's automatic signals to store and hold urine longer, which will increase urgency/frequency.  In this case, the bladder "is running the show" and strategies can be learned to  retrain this pattern.   One should be able to control the first urge to urinate, at around .  The bladder can hold up to a "grande latte," or . To help you gain control, practice the Urge Drill below when urgency strikes.  This drill will help retrain your bladder signals and allow you to store and hold urine longer.  The overall goal is to stretch out your time between voids to reach a more manageable voiding schedule.    Practice your "quick flicks" often throughout the day (each waking hour) even when you don't need feel the urge to go.  This will help strengthen your pelvic floor muscles, making them more effective in controlling leakage.  Urge Drill  When you feel an urge to go, follow these steps to regain control: Stop what you are doing and be still Take one deep breath, directing your air into your abdomen Think an affirming thought, such as "I've got this." Do 5 quick flicks of your pelvic floor Walk with control to the bathroom to void, or delay voiding      [facility] [address] [place], [place] [address] Phone # [phone] Fax [phone]

## 2023-07-31 NOTE — Therapy (Signed)
 OUTPATIENT PHYSICAL THERAPY FEMALE PELVIC EVALUATION   Patient Name: Wanda Duke MRN: [id number] DOB:19-Aug-1960, 63 y.o., female Today's Date: 07/31/2023  END OF SESSION:  PT End of Session - 07/31/23 1338     Visit Number 1    Date for PT Re-Evaluation 01/15/24    Authorization Type UHC/UMR    PT Start Time 1230    PT Stop Time 1310    PT Time Calculation (min) 40 min    Activity Tolerance Patient tolerated treatment well    Behavior During Therapy WFL for tasks assessed/performed             Past Medical History:  Diagnosis Date   Bleeds easily (HCC)    "always have" (12/12/2016)   Past Surgical History:  Procedure Laterality Date   ABDOMINAL HYSTERECTOMY  2007   "still have my ovaries"   APPENDECTOMY     CERVICAL CONE BIOPSY     Conization in early 20's due to cervical cancer   CESAREAN SECTION  1997; 1998   KNEE SURGERY Right    LAPAROSCOPIC APPENDECTOMY N/A 12/04/2016   Procedure: APPENDECTOMY LAPAROSCOPIC;  Surgeon: Emelia Loron, MD;  Location: [facility] OR;  Service: General;  Laterality: N/A;   LAPAROSCOPY     for endometriosis   WISDOM TOOTH EXTRACTION     Patient Active Problem List   Diagnosis Date Noted   S/P laparoscopic appendectomy 12/12/2016   Appendicitis 12/04/2016    PCP: Creola Corn, MD  REFERRING PROVIDER: Jerene Bears, MD   REFERRING DIAG: N39.3 (ICD-10-CM) - Stress incontinence  THERAPY DIAG:  Muscle weakness (generalized)  Unspecified lack of coordination  Abnormal posture  Rationale for Evaluation and Treatment: Rehabilitation  ONSET DATE: years ago  SUBJECTIVE:                                                                                                                                                                                           SUBJECTIVE STATEMENT: Pt states that she will have urinary incontinence with she sneezes, jumps, plays tennis, or runs. She has started hormone replacement therapy 3 months ago.   Fluid intake:   PAIN:  Are you having pain? Yes NPRS scale: 10/10 Pain location: Vaginal  Pain type: sand paper  Pain description: intermittent   Aggravating factors: intercourse Relieving factors: no penetration  PRECAUTIONS: None  RED FLAGS: None   WEIGHT BEARING RESTRICTIONS: No  FALLS:  Has patient fallen in last 6 months? No  OCCUPATION: TV news anchor   ACTIVITY LEVEL : tennis 3x/week, pilates   PLOF: Independent  PATIENT GOALS: improve urinary incontinence  PERTINENT HISTORY:  Abdominal hysterectomy (  2007), appendectomy, c-section x 2, endometriosis, laparoscopic surgery for endometriosis   BOWEL MOVEMENT: Pain with bowel movement: No Type of bowel movement:Frequency 1x/day and Strain no Fully empty rectum: Yes:   Leakage: No Pads: No Fiber supplement/laxative No  URINATION: Pain with urination: No Fully empty bladder: Yes:   Stream: Strong Urgency: Yes - getting home from work  Frequency: 1.5 hours; usually 2x/night, the other night 6x Leakage: Urge to void, Walking to the bathroom, Coughing, Sneezing, Exercise, and tennis Pads: Yes: sometimes if she knows she is going to be more active   INTERCOURSE:  Ability to have vaginal penetration Yes  Pain with intercourse: Initial Penetration, During Penetration, and Deep Penetration DrynessYes  Climax: WNL Marinoff Scale: 2/3 Lubricant: no  PREGNANCY: Vaginal deliveries 0 Tearing No Episiotomy No C-section deliveries 2 Currently pregnant No  PROLAPSE: None   OBJECTIVE:  Note: Objective measures were completed at Evaluation unless otherwise noted.  07/31/23:  PATIENT SURVEYS:   PFIQ-7: 32  COGNITION: Overall cognitive status: Within functional limits for tasks assessed     SENSATION: Light touch: Appears intact   FUNCTIONAL TESTS:  Squat: WNL Single leg stance:  Rt: pelvic drop  Lt: pelvic drop Curl-up test: mild distortion   GAIT: Assistive device utilized:  None Comments: WNL  POSTURE: rounded shoulders, forward head, decreased lumbar lordosis, decreased thoracic kyphosis, posterior pelvic tilt, and Rt iliac crest elevation    LUMBARAROM/PROM:  A/PROM A/PROM  Eval (% available)  Flexion WNL  Extension WNL  Right lateral flexion WNL  Left lateral flexion WNL  Right rotation WNL  Left rotation WNL   (Blank rows = not tested)  PALPATION:   General: WNL  Pelvic Alignment: WNL  Abdominal: Some scar tissue restriction                External Perineal Exam: fragile vulvar tissue, some increase pale tissue in vestibule, skin tage on Lt tissue next to posterior fourchette                             Internal Pelvic Floor: some dryness, atrophy  greater on Lt  Patient confirms identification and approves PT to assess internal pelvic floor and treatment Yes  PELVIC MMT:   MMT eval  Vaginal 3/5, 2 second endurance, 7 repeat contractions   Diastasis Recti WNL  (Blank rows = not tested)        TONE: low  PROLAPSE: WNL  TODAY'S TREATMENT:                                                                                                                              DATE:  4/9/252  EVAL  Neuromuscular re-education: Internal pelvic floor muscle contraction training Quick flicks Long holds Urge drill The knack Therapeutic activities: Vaginal moisturizers Vaginal lubricants (samples given)    PATIENT EDUCATION:  Education details: See above Person educated: Patient Education method: Explanation,  Demonstration, Tactile cues, Verbal cues, and Handouts Education comprehension: verbalized understanding  HOME EXERCISE PROGRAM: [id number]  ASSESSMENT:  CLINICAL IMPRESSION: Patient is a 63 y.o. female who was seen today for physical therapy evaluation and treatment for urinary incontinence. Exam findings notable for abnormal posture, functional core/hip weakness in single leg stance, abdominal restriction, pelvic floor muscle  weakness and decreased endurance, and pelvic floor muscle atrophy. Signs and symptoms are most consistent with pelvic floor muscle weakness and decreased endurance. Initial treatment consisted of pelvic floor muscle contraction training, the knack, urge drill, and education on vaginal lubricants and moisturizers. She will continue to benefit from skilled PT intervention in order to improve pelvic floor muscle strength/endurance, decrease leaking with increased abdominal pressure, play tennis without difficulty, and begin functional strengthening program.   OBJECTIVE IMPAIRMENTS: decreased activity tolerance, decreased coordination, decreased endurance, decreased mobility, decreased strength, increased fascial restrictions, increased muscle spasms, impaired tone, postural dysfunction, and pain.   ACTIVITY LIMITATIONS: continence  PARTICIPATION LIMITATIONS: interpersonal relationship, community activity, and exercise  PERSONAL FACTORS: 1 comorbidity: medical history  are also affecting patient's functional outcome.   REHAB POTENTIAL: Good  CLINICAL DECISION MAKING: Stable/uncomplicated  EVALUATION COMPLEXITY: Low   GOALS: Goals reviewed with patient? Yes  SHORT TERM GOALS: Target date: 08/28/2023   Pt will be independent with HEP.   Baseline: Goal status: INITIAL  2.  Pt will report 25% improvement in urinary incontinence.  Baseline:  Goal status: INITIAL  3.  Pt will be independent with use of the knack and urge drill to help decrease urinary incontinence.  Baseline:  Goal status: INITIAL  4.  Pt will begin using vaginal moisturizers at least 3x/week.  Baseline:  Goal status: INITIAL   LONG TERM GOALS: Target date: 01/15/24  Pt will be independent with advanced HEP.   Baseline:  Goal status: INITIAL  2.  Pt will report 75% improvement in urinary incontinence. Baseline:  Goal status: INITIAL  3.  Pt will be able to play tennis without have to wear pads due to urinary  incontinence.  Baseline:  Goal status: INITIAL  4.  Pt will decrease frequency of nightly trips to the bathroom to 1 or less in order to get restful sleep.   Baseline:  Goal status: INITIAL  5.  Pt will be able to go 2-3 hours in between voids without urgency or incontinence in order to improve QOL and perform all functional activities with less difficulty.   Baseline:  Goal status: INITIAL  6.  Pt will report no leaks with jumping, coughing, sneezing in order to improve comfort with interpersonal relationships and community activities.   Baseline:  Goal status: INITIAL  PLAN:  PT FREQUENCY: 1-2x/week  PT DURATION: 6 months  PLANNED INTERVENTIONS: 97110-Therapeutic exercises, 97530- Therapeutic activity, 97112- Neuromuscular re-education, 97535- Self Care, 52841- Manual therapy, Dry Needling, and Biofeedback  PLAN FOR NEXT SESSION: Begin core strengthening  Julio Alm, PT, DPT04/09/251:41 PM

## 2023-08-07 ENCOUNTER — Ambulatory Visit: Payer: Commercial Managed Care - PPO

## 2023-08-14 ENCOUNTER — Ambulatory Visit: Payer: Commercial Managed Care - PPO

## 2023-08-14 DIAGNOSIS — R293 Abnormal posture: Secondary | ICD-10-CM

## 2023-08-14 DIAGNOSIS — M6281 Muscle weakness (generalized): Secondary | ICD-10-CM | POA: Diagnosis not present

## 2023-08-14 DIAGNOSIS — R279 Unspecified lack of coordination: Secondary | ICD-10-CM

## 2023-08-14 NOTE — Therapy (Signed)
 OUTPATIENT PHYSICAL THERAPY FEMALE PELVIC EVALUATION   Patient Name: Wanda Duke MRN: [id number] DOB:1960/12/18, 63 y.o., female Today's Date: 08/14/2023  END OF SESSION:  PT End of Session - 08/14/23 1240     Visit Number 2    Date for PT Re-Evaluation 01/15/24    Authorization Type [employer]    PT Start Time 1238    PT Stop Time 1310    PT Time Calculation (min) 32 min    Activity Tolerance Patient tolerated treatment well    Behavior During Therapy WFL for tasks assessed/performed             Past Medical History:  Diagnosis Date   Bleeds easily (HCC)    "always have" (12/12/2016)   Past Surgical History:  Procedure Laterality Date   ABDOMINAL HYSTERECTOMY  2007   "still have my ovaries"   APPENDECTOMY     CERVICAL CONE BIOPSY     Conization in early 20's due to cervical cancer   CESAREAN SECTION  1997; 1998   KNEE SURGERY Right    LAPAROSCOPIC APPENDECTOMY N/A 12/04/2016   Procedure: APPENDECTOMY LAPAROSCOPIC;  Surgeon: Enid Harry, MD;  Location: [facility] OR;  Service: General;  Laterality: N/A;   LAPAROSCOPY     for endometriosis   WISDOM TOOTH EXTRACTION     Patient Active Problem List   Diagnosis Date Noted   S/P laparoscopic appendectomy 12/12/2016   Appendicitis 12/04/2016    PCP: Margarete Sharps, MD  REFERRING PROVIDER: Lillian Rein, MD   REFERRING DIAG: N39.3 (ICD-10-CM) - Stress incontinence  THERAPY DIAG:  Muscle weakness (generalized)  Unspecified lack of coordination  Abnormal posture  Rationale for Evaluation and Treatment: Rehabilitation  ONSET DATE: years ago  SUBJECTIVE:                                                                                                                                                                                           SUBJECTIVE STATEMENT: Pt states that she has not noticed much change, but has also not been working on HEP much.   PAIN:  Are you having pain? Yes NPRS scale: 10/10 Pain  location: Vaginal  Pain type: sand paper  Pain description: intermittent   Aggravating factors: intercourse Relieving factors: no penetration  PRECAUTIONS: None  RED FLAGS: None   WEIGHT BEARING RESTRICTIONS: No  FALLS:  Has patient fallen in last 6 months? No  OCCUPATION: TV news anchor   ACTIVITY LEVEL : tennis 3x/week, pilates   PLOF: Independent  PATIENT GOALS: improve urinary incontinence  PERTINENT HISTORY:  Abdominal hysterectomy (2007), appendectomy, c-section x 2, endometriosis, laparoscopic surgery for endometriosis  BOWEL MOVEMENT: Pain with bowel movement: No Type of bowel movement:Frequency 1x/day and Strain no Fully empty rectum: Yes:   Leakage: No Pads: No Fiber supplement/laxative No  URINATION: Pain with urination: No Fully empty bladder: Yes:   Stream: Strong Urgency: Yes - getting home from work  Frequency: 1.5 hours; usually 2x/night, the other night 6x Leakage: Urge to void, Walking to the bathroom, Coughing, Sneezing, Exercise, and tennis Pads: Yes: sometimes if she knows she is going to be more active   INTERCOURSE:  Ability to have vaginal penetration Yes  Pain with intercourse: Initial Penetration, During Penetration, and Deep Penetration DrynessYes  Climax: WNL Marinoff Scale: 2/3 Lubricant: no  PREGNANCY: Vaginal deliveries 0 Tearing No Episiotomy No C-section deliveries 2 Currently pregnant No  PROLAPSE: None   OBJECTIVE:  Note: Objective measures were completed at Evaluation unless otherwise noted.  07/31/23:  PATIENT SURVEYS:   PFIQ-7: 78  COGNITION: Overall cognitive status: Within functional limits for tasks assessed     SENSATION: Light touch: Appears intact   FUNCTIONAL TESTS:  Squat: WNL Single leg stance:  Rt: pelvic drop  Lt: pelvic drop Curl-up test: mild distortion   GAIT: Assistive device utilized: None Comments: WNL  POSTURE: rounded shoulders, forward head, decreased lumbar lordosis,  decreased thoracic kyphosis, posterior pelvic tilt, and Rt iliac crest elevation    LUMBARAROM/PROM:  A/PROM A/PROM  Eval (% available)  Flexion WNL  Extension WNL  Right lateral flexion WNL  Left lateral flexion WNL  Right rotation WNL  Left rotation WNL   (Blank rows = not tested)  PALPATION:   General: WNL  Pelvic Alignment: WNL  Abdominal: Some scar tissue restriction                External Perineal Exam: fragile vulvar tissue, some increase pale tissue in vestibule, skin tage on Lt tissue next to posterior fourchette                             Internal Pelvic Floor: some dryness, atrophy  greater on Lt  Patient confirms identification and approves PT to assess internal pelvic floor and treatment Yes  PELVIC MMT:   MMT eval  Vaginal 3/5, 2 second endurance, 7 repeat contractions   Diastasis Recti WNL  (Blank rows = not tested)        TONE: low  PROLAPSE: WNL  TODAY'S TREATMENT:                                                                                                                              DATE:  08/14/23 Neuromuscular re-education: Transversus abdominus training with multimodal cues for improved motor control and breath coordination Transversus abdominus isometrics  Bil supine UE ball press with transversus abdominus and pelvic floor muscle contractions and breath coordination 10x Supine hip adduction ball press with transversus abdominus and pelvic floor muscle contractions and breath coordination 10x Bridge with  hip adduction, transversus abdominus, and pelvic floor muscle 2 x 10 Supine leg extensions 10x bil Seated hip adduction ball press with transversus abdominus and pelvic floor muscle 2 x 10 Seated hip abduction red band with transversus abdominus and pelvic floor muscle 2 x 10 Seated resisted march red band with transversus abdominus and pelvic floor muscle 2 x 10   4/9/252  EVAL  Neuromuscular re-education: Internal pelvic floor  muscle contraction training Quick flicks Long holds Urge drill The knack Therapeutic activities: Vaginal moisturizers Vaginal lubricants (samples given)    PATIENT EDUCATION:  Education details: See above Person educated: Patient Education method: Explanation, Demonstration, Tactile cues, Verbal cues, and Handouts Education comprehension: verbalized understanding  HOME EXERCISE PROGRAM: [id number]  ASSESSMENT:  CLINICAL IMPRESSION: Pt has been unable to practice initial HEP since her first visit on regular basis. She will continue to benefit from skilled PT intervention in order to improve pelvic floor muscle strength/endurance, decrease leaking with increased abdominal pressure, play tennis without difficulty, and begin functional strengthening program.   OBJECTIVE IMPAIRMENTS: decreased activity tolerance, decreased coordination, decreased endurance, decreased mobility, decreased strength, increased fascial restrictions, increased muscle spasms, impaired tone, postural dysfunction, and pain.   ACTIVITY LIMITATIONS: continence  PARTICIPATION LIMITATIONS: interpersonal relationship, community activity, and exercise  PERSONAL FACTORS: 1 comorbidity: medical history  are also affecting patient's functional outcome.   REHAB POTENTIAL: Good  CLINICAL DECISION MAKING: Stable/uncomplicated  EVALUATION COMPLEXITY: Low   GOALS: Goals reviewed with patient? Yes  SHORT TERM GOALS: Target date: 08/28/2023   Pt will be independent with HEP.   Baseline: Goal status: INITIAL  2.  Pt will report 25% improvement in urinary incontinence.  Baseline:  Goal status: INITIAL  3.  Pt will be independent with use of the knack and urge drill to help decrease urinary incontinence.  Baseline:  Goal status: INITIAL  4.  Pt will begin using vaginal moisturizers at least 3x/week.  Baseline:  Goal status: INITIAL   LONG TERM GOALS: Target date: 01/15/24  Pt will be independent with  advanced HEP.   Baseline:  Goal status: INITIAL  2.  Pt will report 75% improvement in urinary incontinence. Baseline:  Goal status: INITIAL  3.  Pt will be able to play tennis without have to wear pads due to urinary incontinence.  Baseline:  Goal status: INITIAL  4.  Pt will decrease frequency of nightly trips to the bathroom to 1 or less in order to get restful sleep.   Baseline:  Goal status: INITIAL  5.  Pt will be able to go 2-3 hours in between voids without urgency or incontinence in order to improve QOL and perform all functional activities with less difficulty.   Baseline:  Goal status: INITIAL  6.  Pt will report no leaks with jumping, coughing, sneezing in order to improve comfort with interpersonal relationships and community activities.   Baseline:  Goal status: INITIAL  PLAN:  PT FREQUENCY: 1-2x/week  PT DURATION: 6 months  PLANNED INTERVENTIONS: 97110-Therapeutic exercises, 97530- Therapeutic activity, 97112- Neuromuscular re-education, 97535- Self Care, [id number]- Manual therapy, Dry Needling, and Biofeedback  PLAN FOR NEXT SESSION: Begin core strengthening  Verlena Glenn, PT, DPT04/23/251:06 PM

## 2023-10-08 ENCOUNTER — Ambulatory Visit

## 2024-05-11 ENCOUNTER — Other Ambulatory Visit: Payer: Self-pay | Admitting: Obstetrics & Gynecology

## 2024-05-11 DIAGNOSIS — Z1231 Encounter for screening mammogram for malignant neoplasm of breast: Secondary | ICD-10-CM

## 2024-05-19 ENCOUNTER — Ambulatory Visit: Admission: RE | Admit: 2024-05-19 | Discharge: 2024-05-19 | Disposition: A | Source: Ambulatory Visit

## 2024-05-19 DIAGNOSIS — Z1231 Encounter for screening mammogram for malignant neoplasm of breast: Secondary | ICD-10-CM

## 2024-10-26 ENCOUNTER — Ambulatory Visit (HOSPITAL_BASED_OUTPATIENT_CLINIC_OR_DEPARTMENT_OTHER): Payer: Self-pay | Admitting: Obstetrics & Gynecology
